# Patient Record
Sex: Male | Born: 1970 | Race: White | Hispanic: No | Smoking: Former smoker
Health system: Southern US, Community
[De-identification: ages and names within clinical notes are randomized; demographics above are authoritative.]

## PROBLEM LIST (undated history)

## (undated) DIAGNOSIS — N200 Calculus of kidney: Secondary | ICD-10-CM

## (undated) DIAGNOSIS — F319 Bipolar disorder, unspecified: Secondary | ICD-10-CM

## (undated) DIAGNOSIS — K5090 Crohn's disease, unspecified, without complications: Secondary | ICD-10-CM

## (undated) HISTORY — PX: OTHER SURGICAL HISTORY: SHX169

---

## 2009-05-24 ENCOUNTER — Emergency Department (HOSPITAL_COMMUNITY): Admission: EM | Admit: 2009-05-24 | Discharge: 2009-05-24 | Payer: Self-pay | Admitting: Emergency Medicine

## 2009-05-24 IMAGING — CT CT PELVIS W/O CM
2 of 4 series · 17 of 46 positions shown, 19 images · IV contrast (agent unspecified)
Comparison: None

CT ABDOMEN:

CLINICAL DATA: Left flank and left lower quadrant pain, nausea,
history kidney stones

CT ABDOMEN AND PELVIS WITHOUT CONTRAST:
TECHNIQUE: Multidetector helical CT imaging of the abdomen and
pelvis were performed using kidney stone protocol.  Neither oral
nor intravenous contrast utilized for this indication.  Sagittal
and coronal MPR images reconstructed from axial data set. Breast
shield utilized.

[Series 2: stone <(id) >(id) · axial · 0.81mm/px · z∈[-517,-97]mm · 14 of 92 slices shown, 16 images]
[im 4/92  soft-tissue]
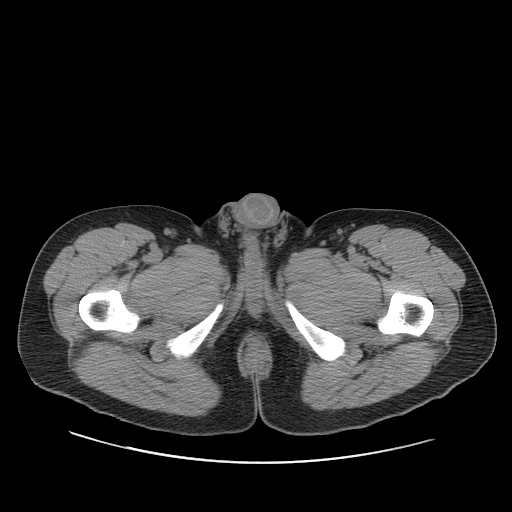
[im 4/92  bone]
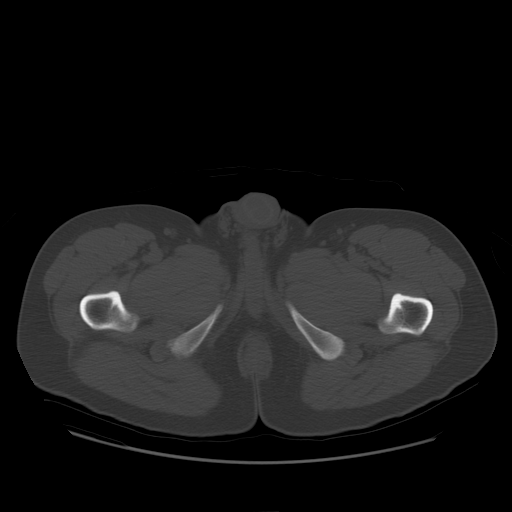
[im 12/92  soft-tissue]
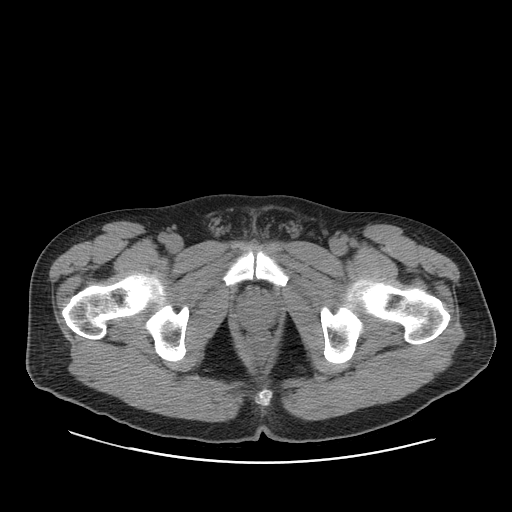
[im 19/92  soft-tissue]
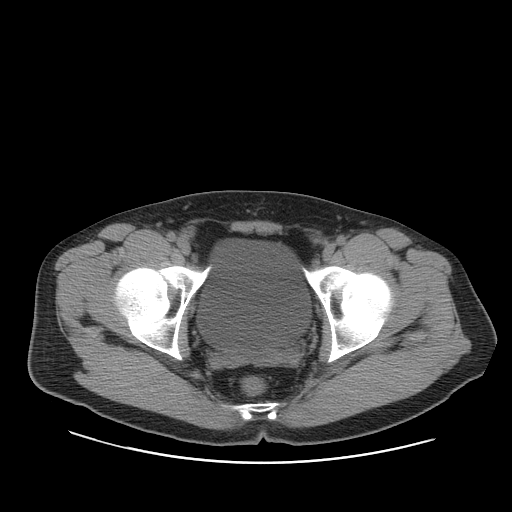
[im 23/92  soft-tissue]
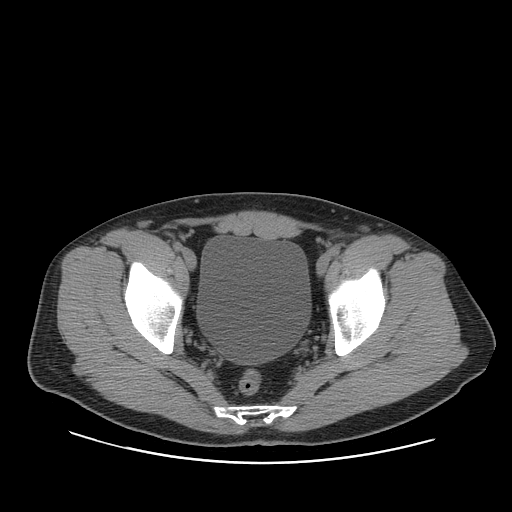
[im 31/92  soft-tissue]
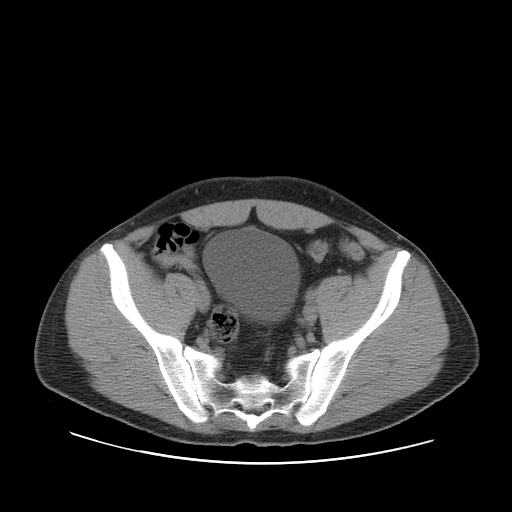
[im 38/92  soft-tissue]
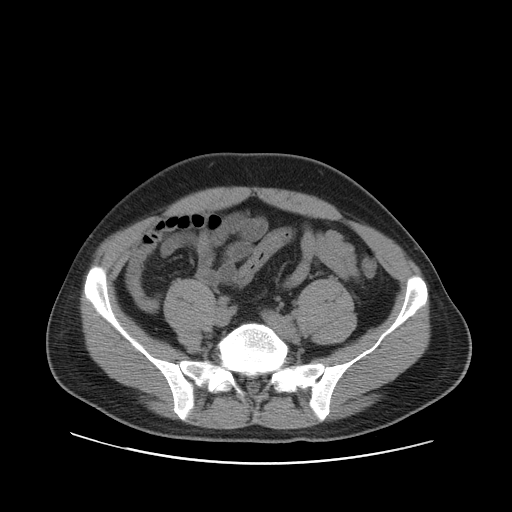
[im 42/92  soft-tissue]
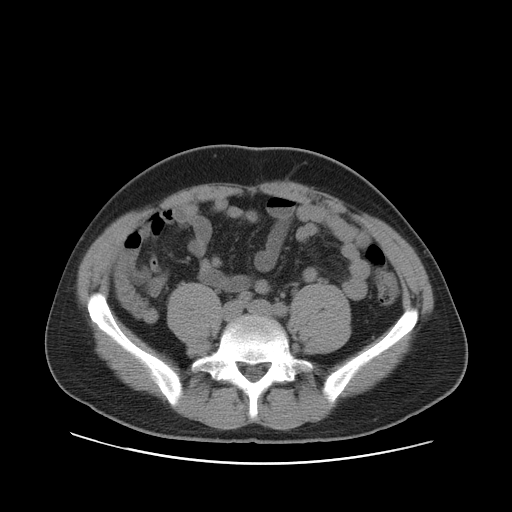
[im 50/92  soft-tissue]
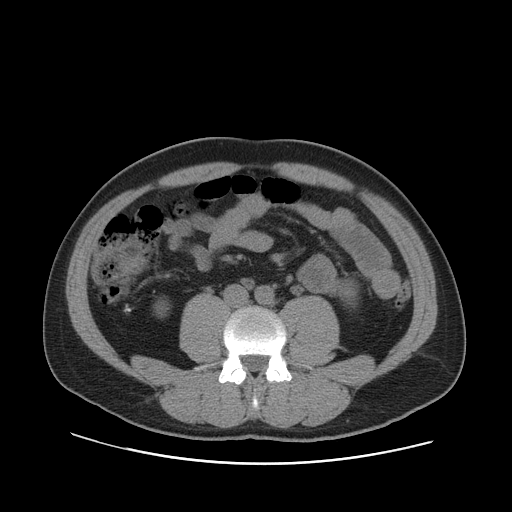
[im 54/92  soft-tissue]
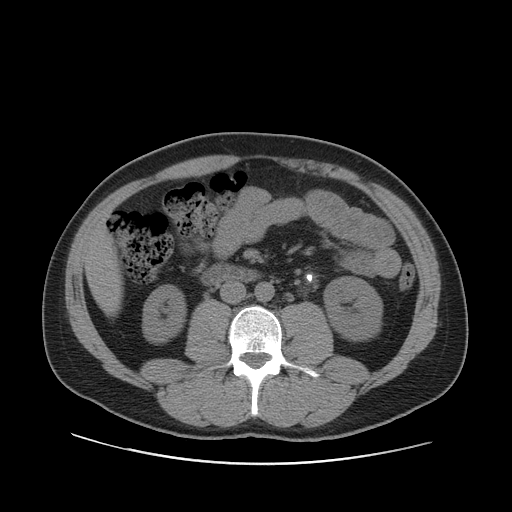
[im 54/92  bone]
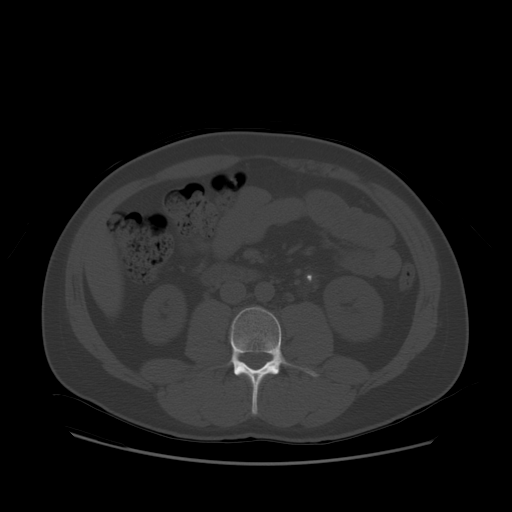
[im 61/92  soft-tissue]
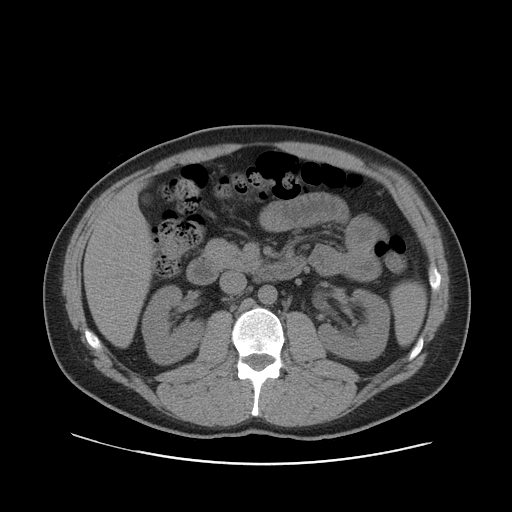
[im 69/92  soft-tissue]
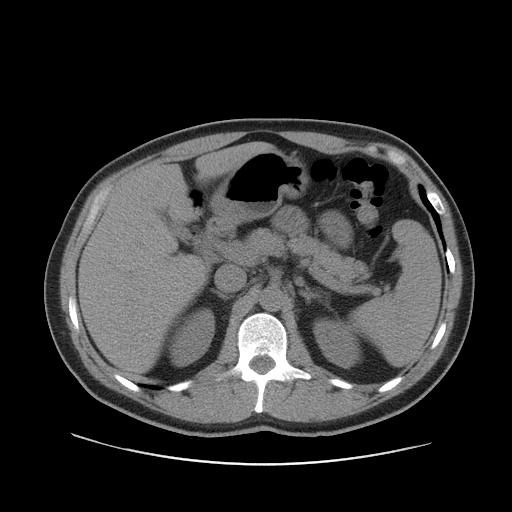
[im 73/92  soft-tissue]
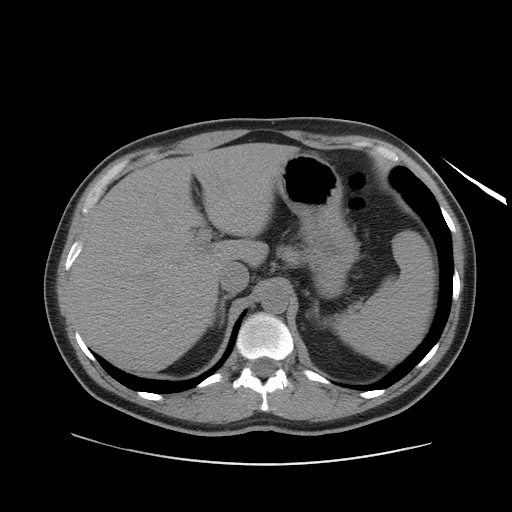
[im 80/92  soft-tissue]
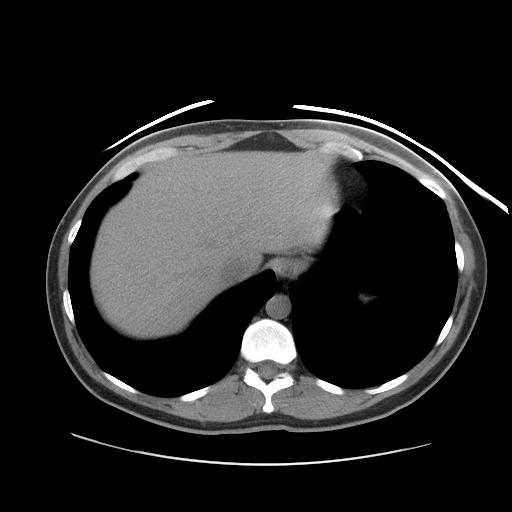
[im 88/92  soft-tissue]
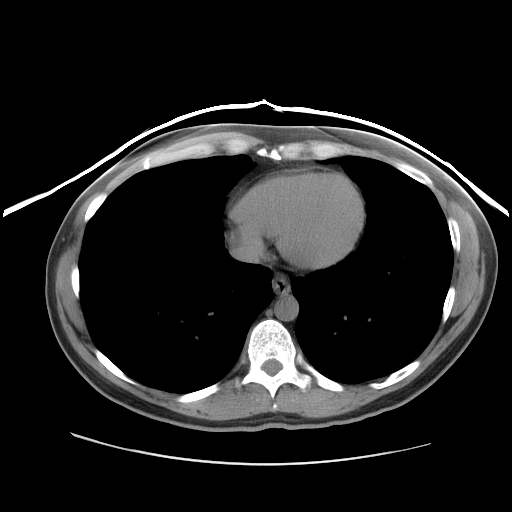

[Series 401: cor · coronal · 0.86mm/px · 3 of 97 slices shown]
[im 33/97  soft-tissue]
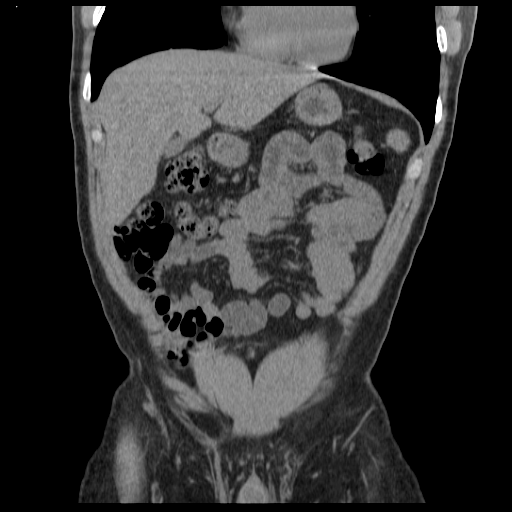
[im 43/97  soft-tissue]
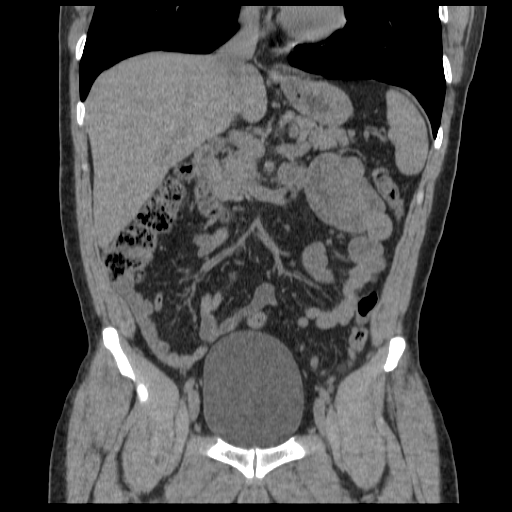
[im 54/97  soft-tissue]
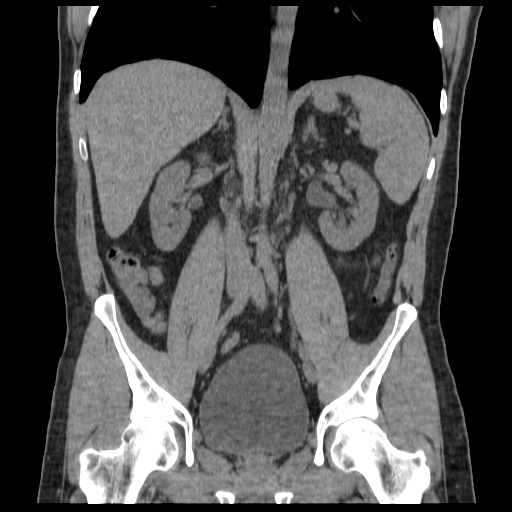

[17 of 46 positions shown; findings below may reference images not displayed]

FINDINGS: Lung bases clear.
Multiple nonobstructing left renal calculi.
In addition, mild left hydronephrosis secondary to a 6 mm diameter
calculus at the ureteral pelvic junction.
Minimal peri pelvic edema but no ureteral dilatation.
Single tiny calcified granuloma liver.
Solid organs and bowel loops in upper abdomen otherwise
unremarkable.
Prior appendectomy.
No upper abdominal mass, adenopathy, or free fluid.
IMPRESSION: Left hydronephrosis secondary to a 6 mm diameter UPJ calculus.
Additional nonobstructing left renal calculi.

CT PELVIS:
FINDINGS: No distal ureteral calcification or dilatation.
Unremarkable bladder.
No pelvic mass, adenopathy, or free fluid.
Bowel loops unremarkable.
Degenerative disc disease changes L5-S1.
IMPRESSION: No acute intrapelvic abnormalities.

## 2009-05-31 ENCOUNTER — Ambulatory Visit (HOSPITAL_COMMUNITY): Admission: RE | Admit: 2009-05-31 | Discharge: 2009-05-31 | Payer: Self-pay | Admitting: Urology

## 2009-05-31 IMAGING — CR DG ABDOMEN 1V
1 series · 1 of 1 positions shown · non-contrast
Comparison: CT on [DATE]

CLINICAL DATA: Left ureteral calculus.

ABDOMEN - 1 VIEW

[t abdomen supine]
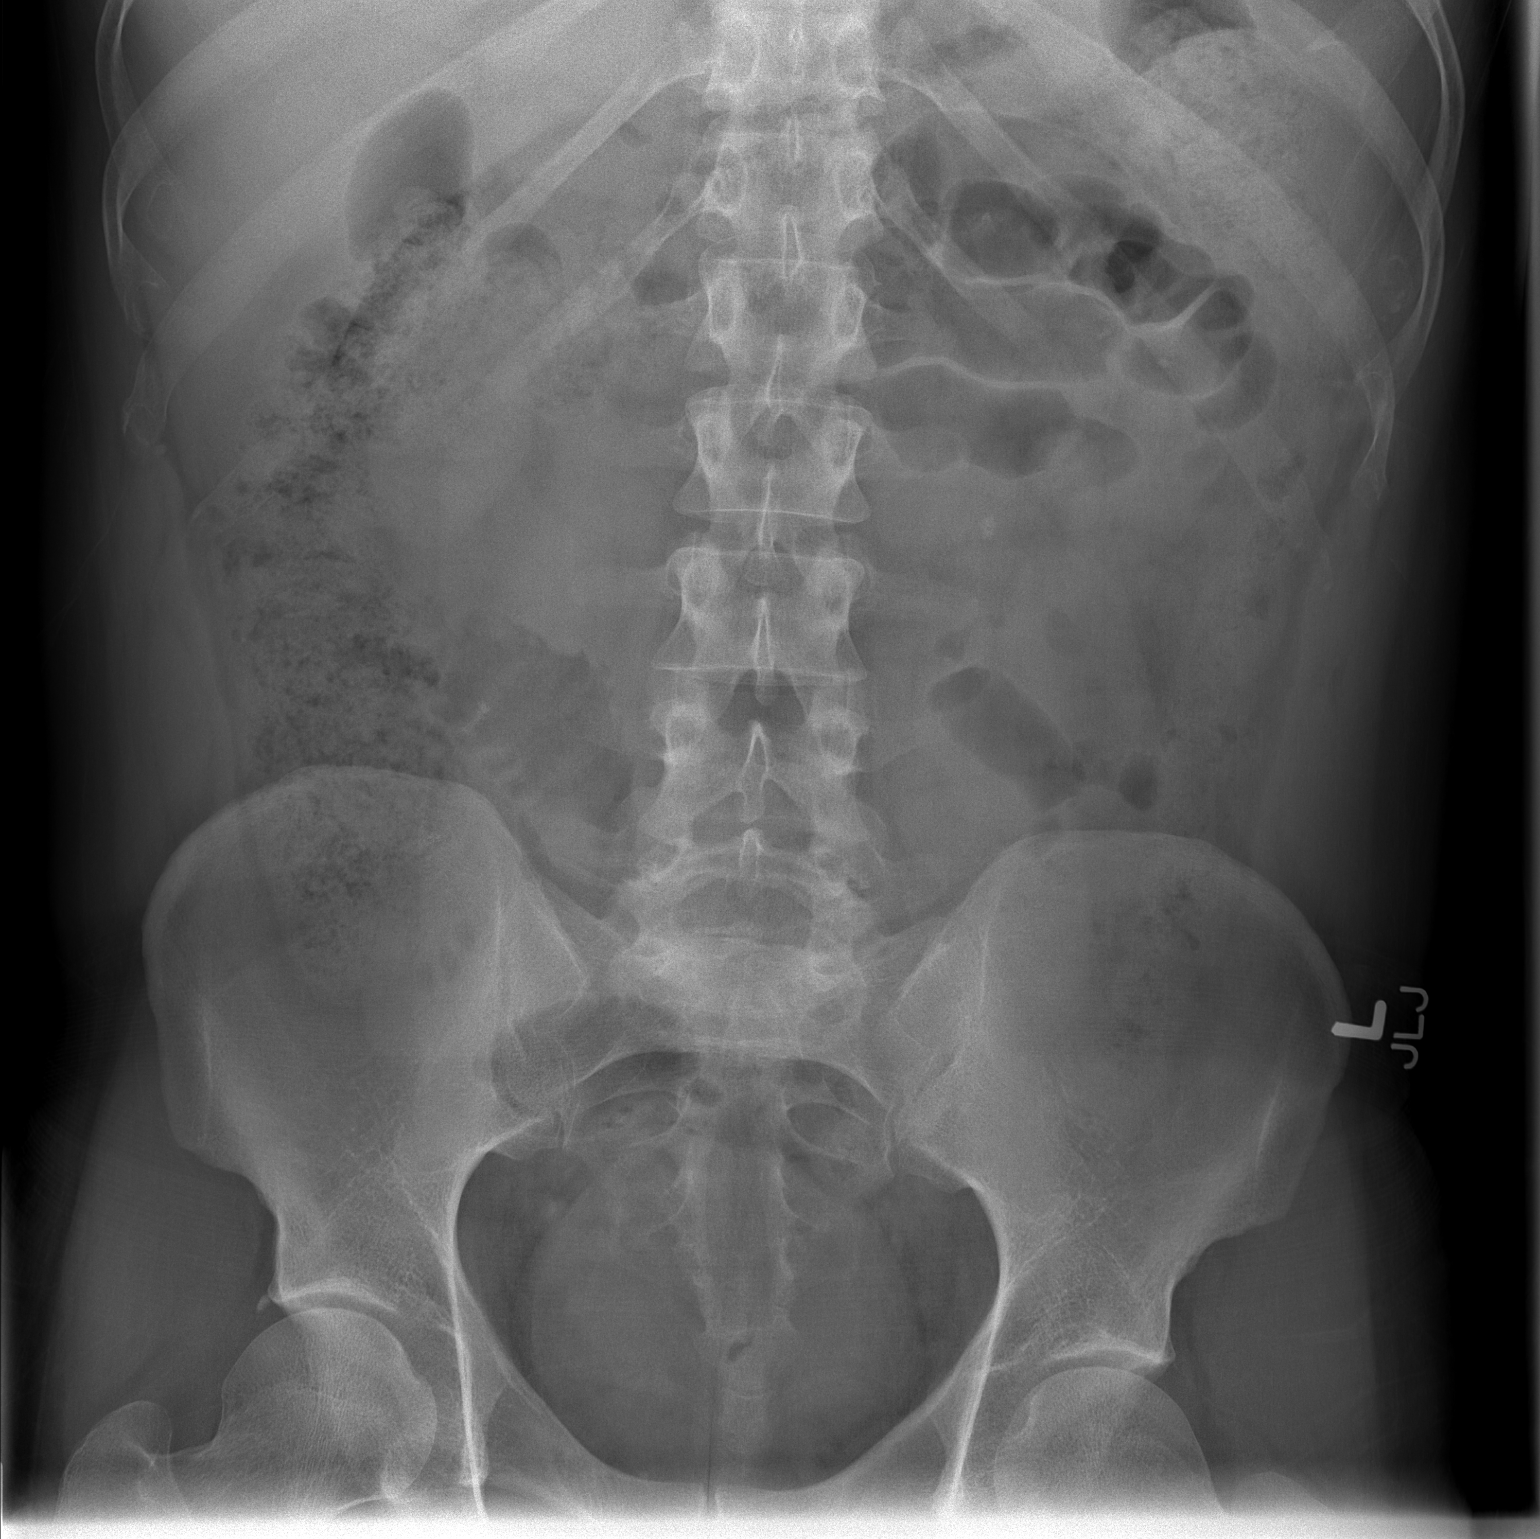

[1 of 1 positions shown; findings below may reference images not displayed]

FINDINGS: A 6 mm calculus is again seen in expected region of the
left ureteropelvic junction.  Several tiny left intrarenal calculi
are again noted.  The bowel gas pattern is normal.
IMPRESSION: 1.  6 mm calculus remains in expected position of left
ureteropelvic junction.
2.  Tiny left intrarenal calculi also noted.

## 2009-07-10 ENCOUNTER — Emergency Department (HOSPITAL_COMMUNITY): Admission: EM | Admit: 2009-07-10 | Discharge: 2009-07-11 | Payer: Self-pay | Admitting: Emergency Medicine

## 2009-07-11 IMAGING — CT CT ABDOMEN W/ CM
2 of 4 series · 13 of 32 positions shown, 18 images · IV contrast (100 ML OMNI 300)
Comparison: Plain films abdomen [DATE] and CT abdomen and
pelvis [DATE].

CT ABDOMEN

CLINICAL DATA: Abdominal pain.  History of abdominal fistula.
Question abscess.

CT ABDOMEN AND PELVIS WITH CONTRAST
TECHNIQUE: Multidetector CT imaging of the abdomen and pelvis was
performed using the standard protocol following bolus
administration of intravenous contrast.
Contrast: 100 ml [PE].

[Series 2: routine abdomen · axial · 0.78mm/px · z∈[-466,-136]mm · 5 of 100 slices shown, 10 images]
[im 17/100  soft-tissue]
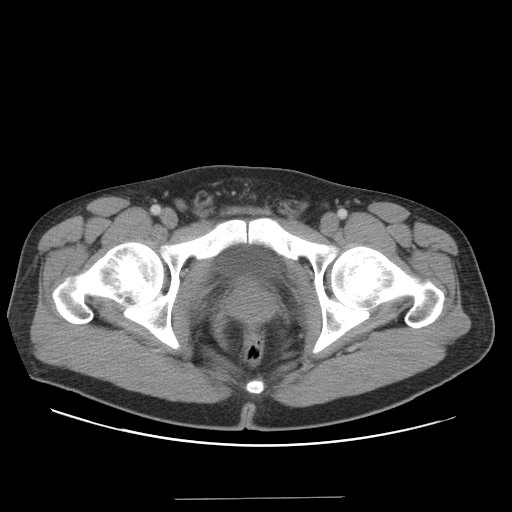
[im 17/100  bone]
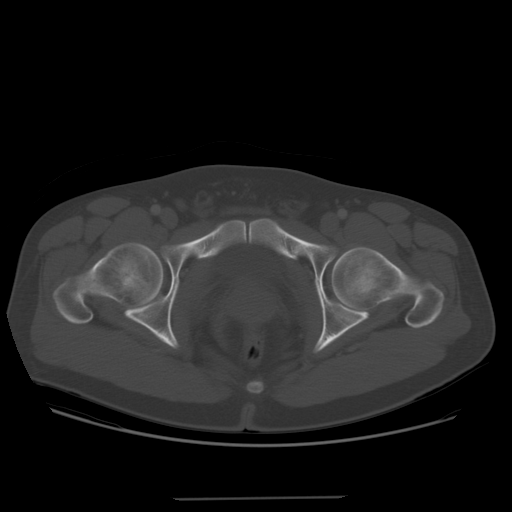
[im 34/100  soft-tissue]
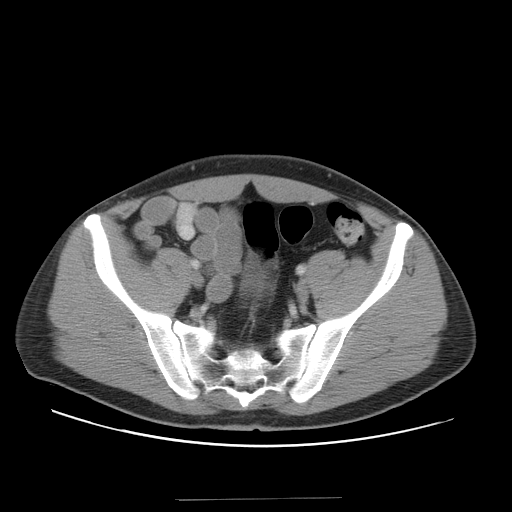
[im 34/100  lung]
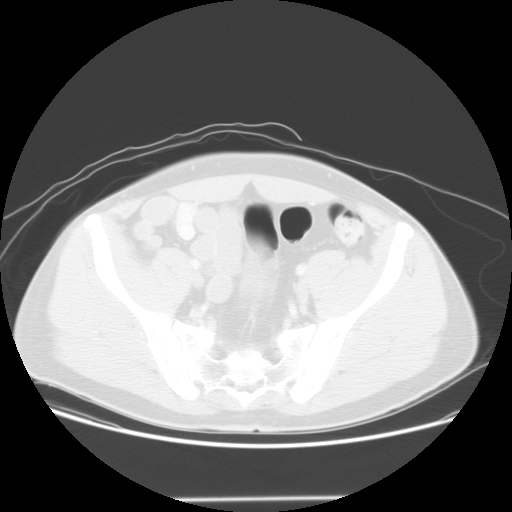
[im 50/100  soft-tissue]
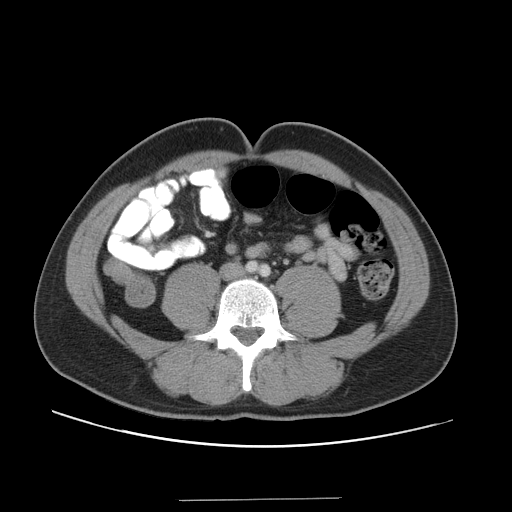
[im 50/100  lung]
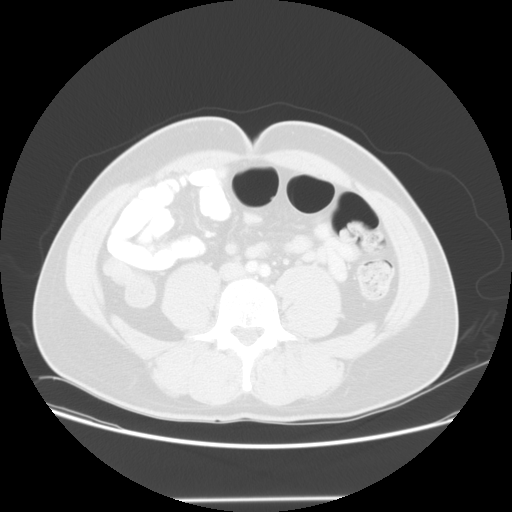
[im 67/100  soft-tissue]
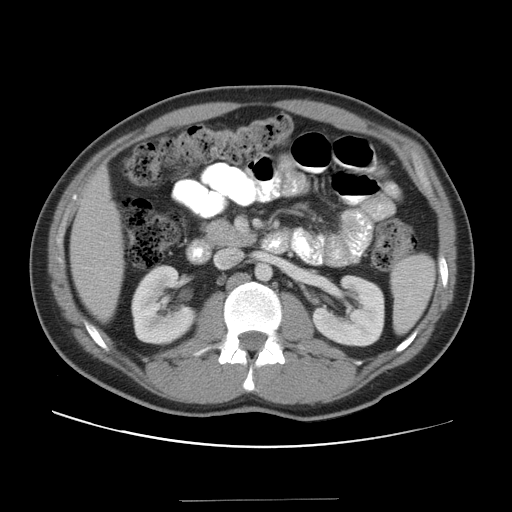
[im 67/100  lung]
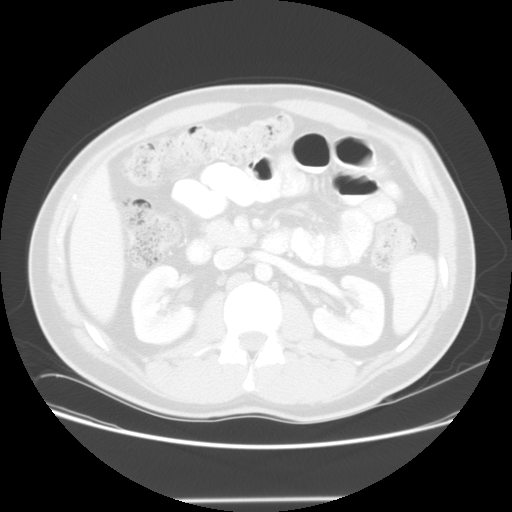
[im 83/100  soft-tissue]
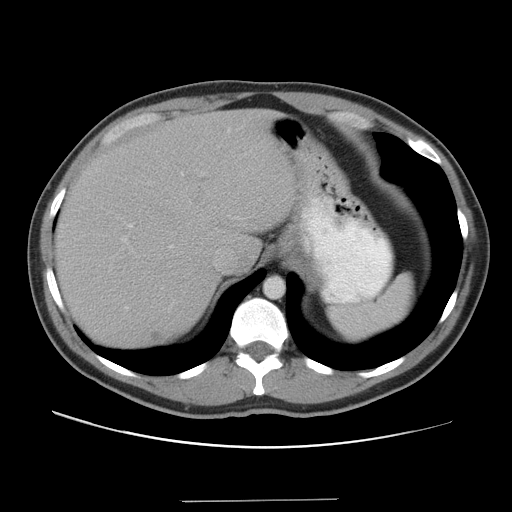
[im 83/100  lung]
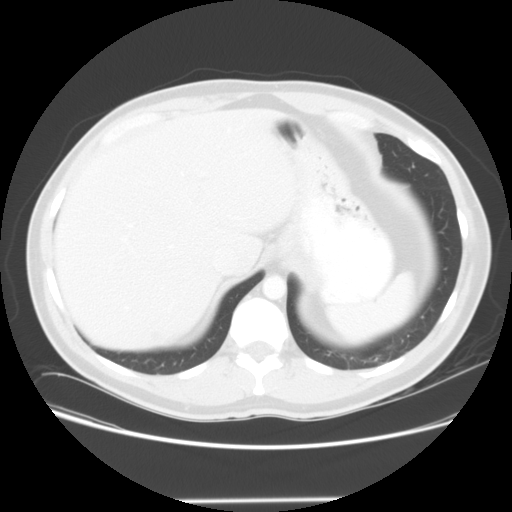

[Series 103: reformatted · sagittal · 0.99mm/px · 8 of 177 slices shown]
[im 15/177  soft-tissue]
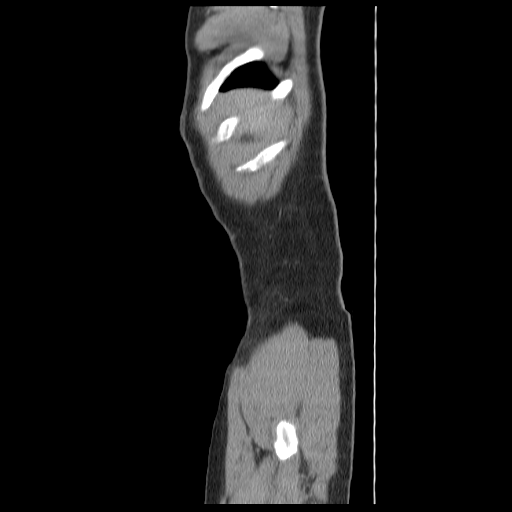
[im 45/177  soft-tissue]
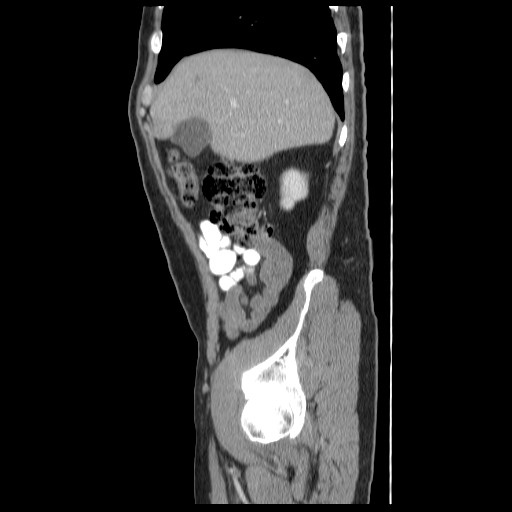
[im 59/177  soft-tissue]
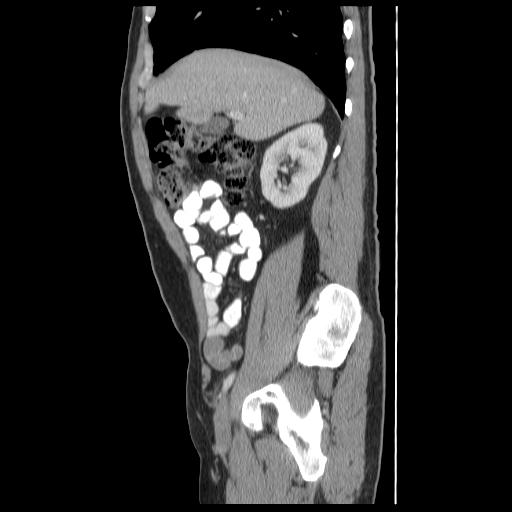
[im 74/177  soft-tissue]
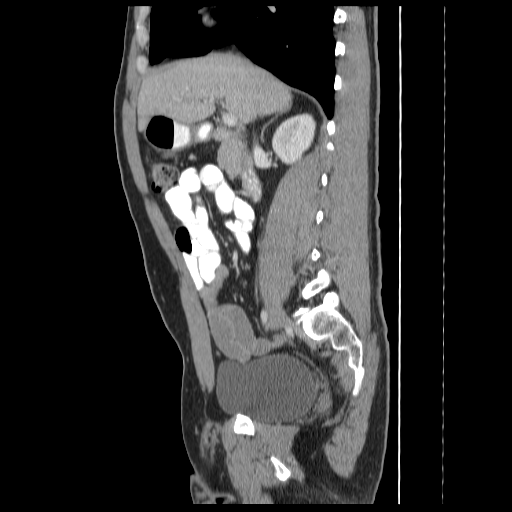
[im 103/177  soft-tissue]
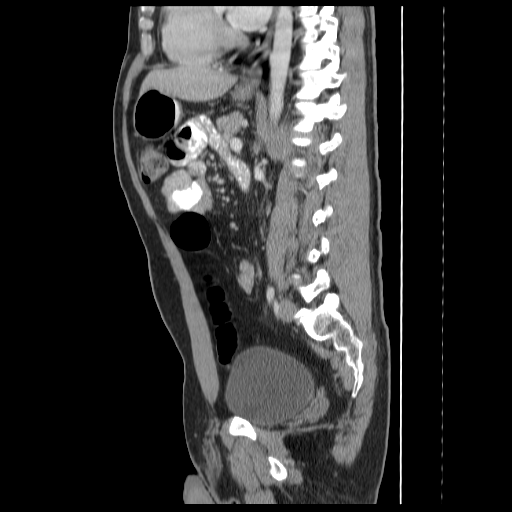
[im 118/177  soft-tissue]
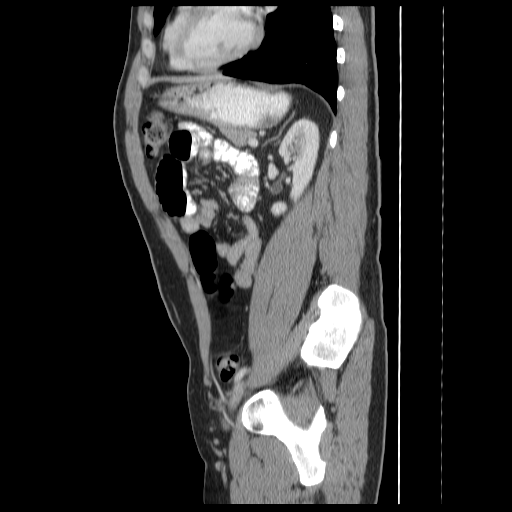
[im 133/177  soft-tissue]
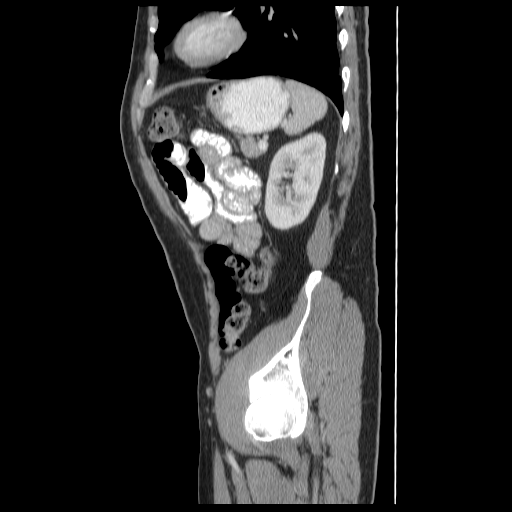
[im 162/177  soft-tissue]
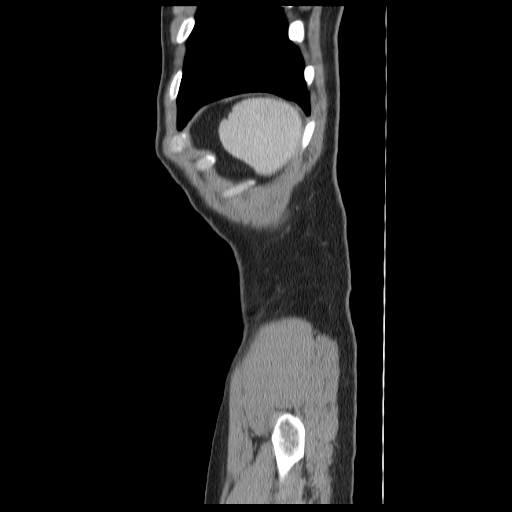

[13 of 32 positions shown; findings below may reference images not displayed]

FINDINGS: There is some linear atelectasis or scar in the lung
bases.  Lung bases otherwise clear.  No pleural or pericardial
effusion.

No abdominal fluid collection to suggest abscess is identified.
Three small lesions measuring approximate 1 cm scattered in the
liver are compatible with either small cysts or possibly
hemangiomas.  At least one of these,  has peripheral nodular
enhancement.  The liver is otherwise unremarkable.  The
gallbladder, adrenal glands, spleen, pancreas and right kidney
appear normal.  Left hydronephrosis present on the prior study is
resolved.  Two nonobstructing left renal stones are again noted.
The left ureteral stone seen on the prior study is no longer
present.  No lymphadenopathy.  Stomach and small bowel appear
normal.  No focal bony abnormality.
IMPRESSION: 1.  No acute finding in the abdomen.
2.  Two nonobstructing left renal stones.
3.  Three small hepatic cysts versus hemangiomas.  One of these has
features typical of a hemangioma.

CT PELVIS
FINDINGS: There is no pelvic abscess.  No lymphadenopathy.
Urinary bladder, seminal vesicles and prostate gland appear normal.
The colon appears normal.  The appendix has been removed.  The no
focal bony abnormality.
IMPRESSION: Negative for pelvic abscess.  Negative pelvic CT.

## 2010-07-12 ENCOUNTER — Emergency Department: Payer: Self-pay | Admitting: Emergency Medicine

## 2010-07-12 IMAGING — CR DG SHOULDER 3+V*L*
1 series · 3 of 3 positions shown · non-contrast
Comparison: none

REASON FOR EXAM: increased pain
COMMENTS:

PROCEDURE:     DXR - DXR SHOULDER LEFT COMPLETE  - [DATE]  [DATE]
RESULT:     Three views of the left shoulder are submitted. The glenohumeral
joint is normally positioned. The AC joint does not appear abnormally
widened. There is no evidence of an acute fracture.

[Series 1: view not recorded · 0.17mm/px · 3 of 3 slices shown]
[im 1/3]
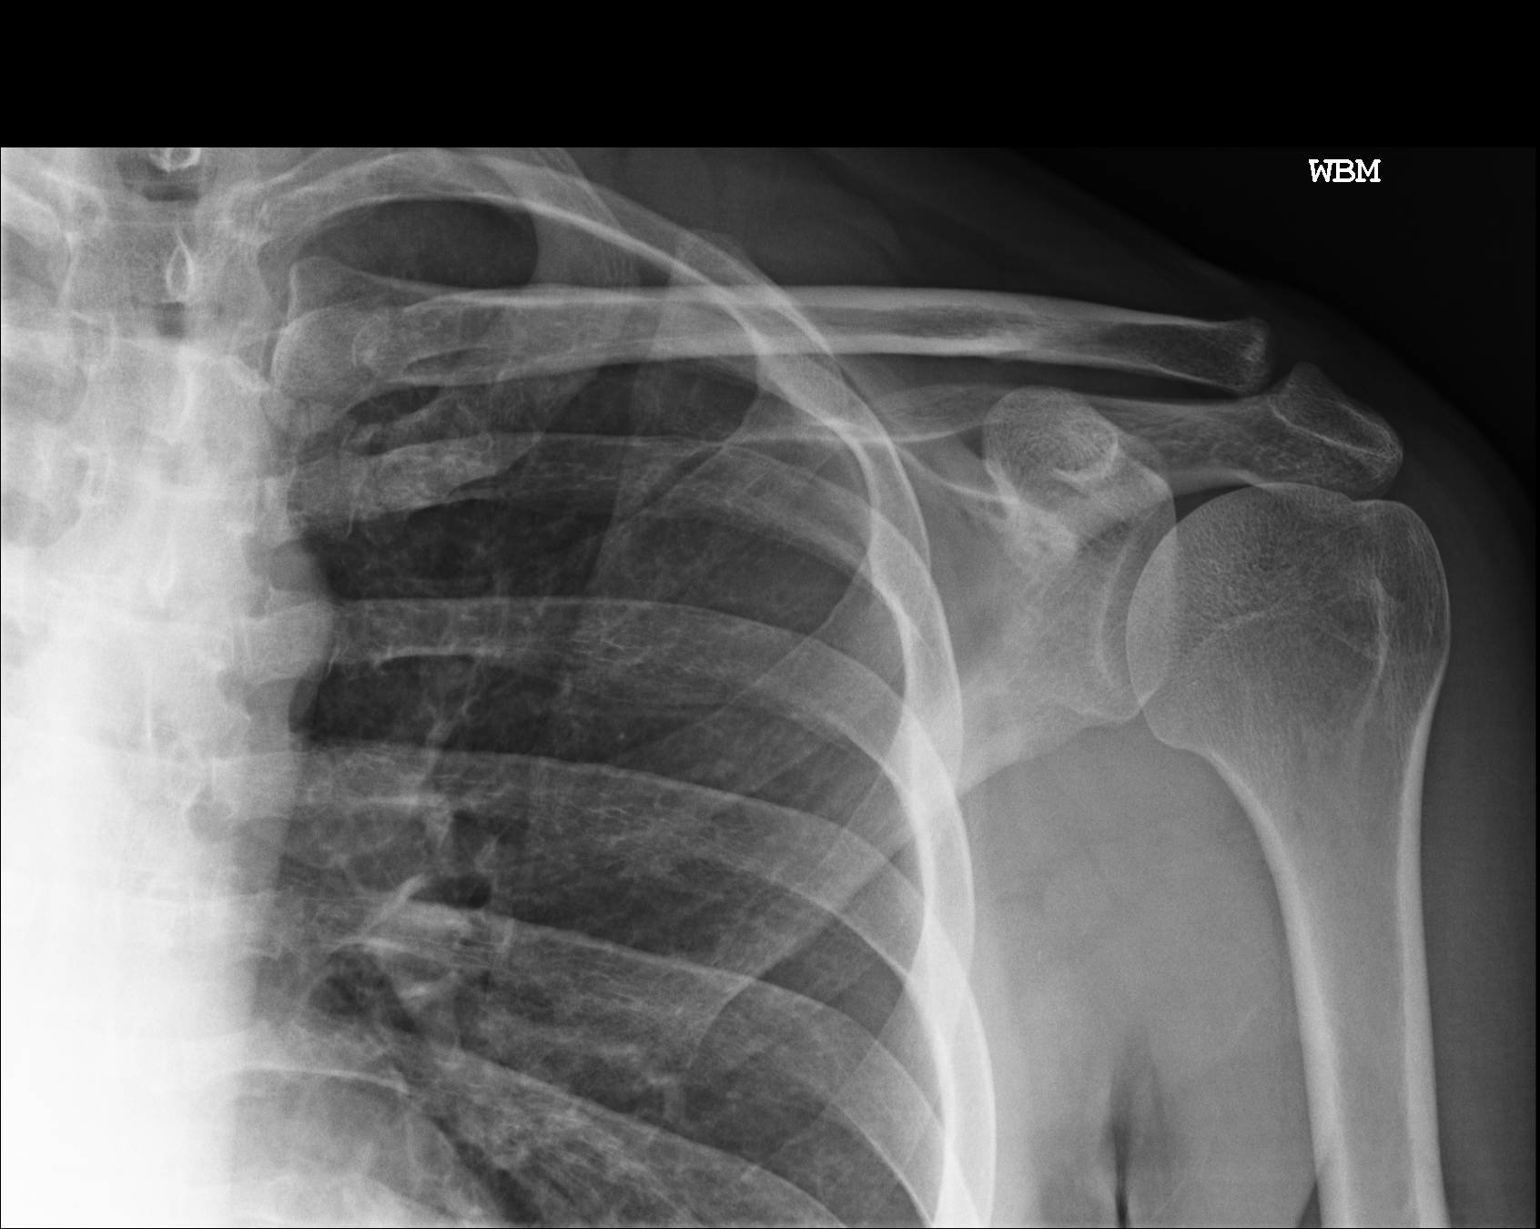
[im 2/3]
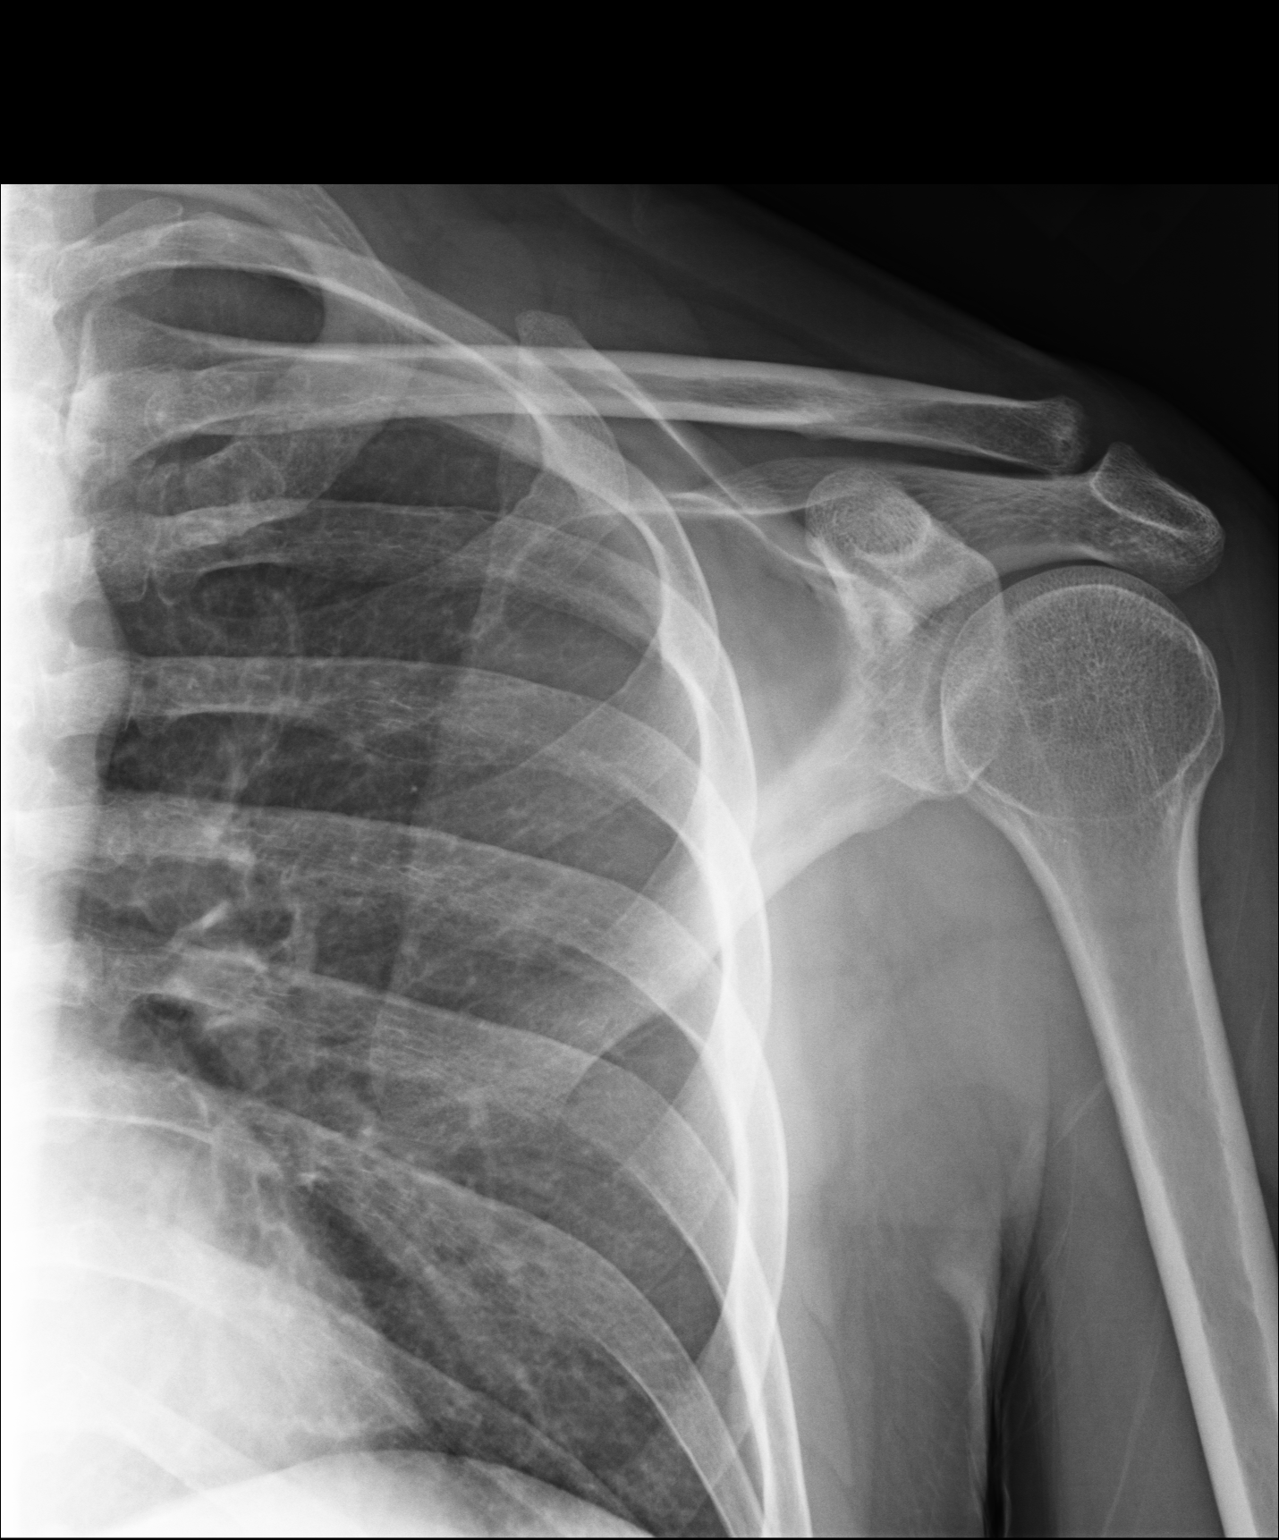
[im 3/3]
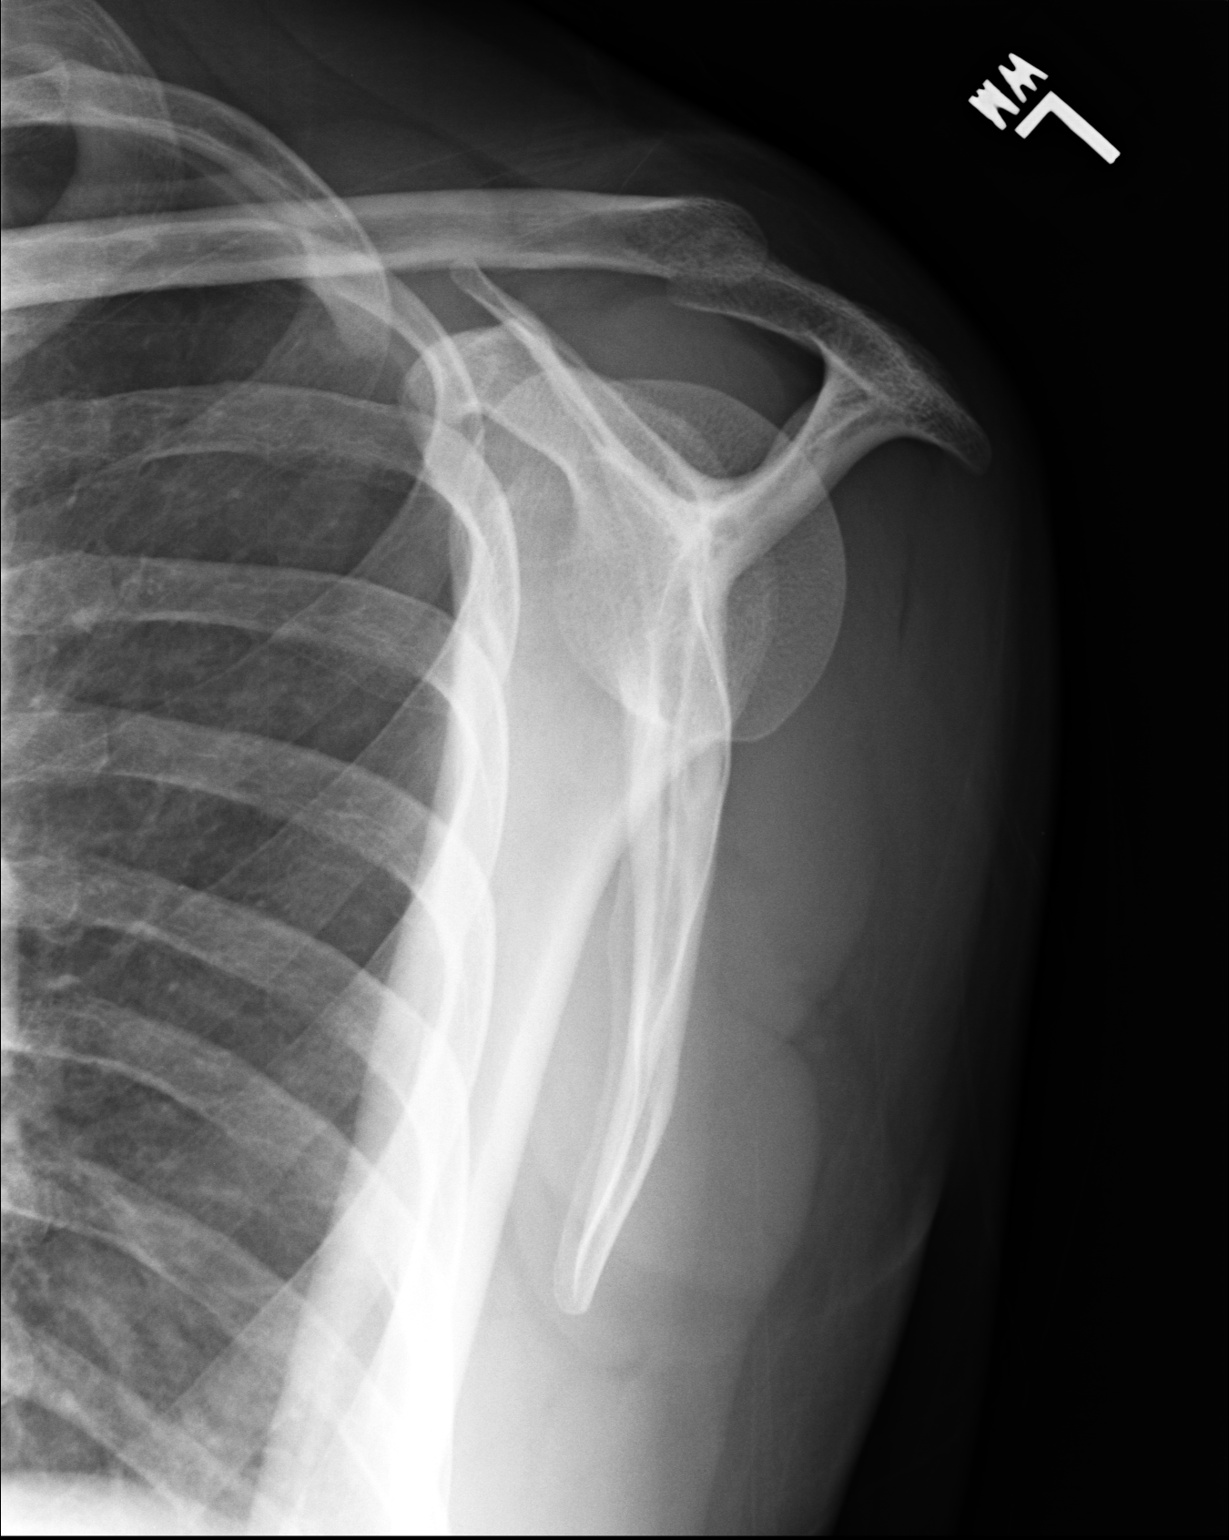

[3 of 3 positions shown; findings below may reference images not displayed]

IMPRESSION: I do not see evidence of fracture or dislocation involving
the glenohumeral joint nor the AC joint.

## 2010-12-19 LAB — BASIC METABOLIC PANEL
BUN: 17 mg/dL (ref 6–23)
CO2: 27 mEq/L (ref 19–32)
Calcium: 9.2 mg/dL (ref 8.4–10.5)
Chloride: 104 mEq/L (ref 96–112)
Creatinine, Ser: 0.83 mg/dL (ref 0.4–1.5)
GFR calc Af Amer: >60 mL/min (ref >60)
Potassium: 4.1 mEq/L (ref 3.5–5.1)

## 2010-12-19 LAB — DIFFERENTIAL
Eosinophils Relative: 3 % (ref 0–5)
Lymphs Abs: 3.8 10*3/uL (ref 0.7–4.0)
Monocytes Relative: 10 % (ref 3–12)
Neutro Abs: 3.7 10*3/uL (ref 1.7–7.7)
Neutrophils Relative %: 43 % (ref 43–77)

## 2010-12-19 LAB — URINALYSIS, ROUTINE W REFLEX MICROSCOPIC
Bilirubin Urine: NEGATIVE
Nitrite: NEGATIVE
Protein, ur: NEGATIVE mg/dL

## 2010-12-19 LAB — CBC
HCT: 44.5 % (ref 39.0–52.0)
Hemoglobin: 15.7 g/dL (ref 13.0–17.0)
MCHC: 35.2 g/dL (ref 30.0–36.0)
MCV: 90.8 fL (ref 78.0–100.0)
Platelets: 216 10*3/uL (ref 150–400)
RBC: 4.91 MIL/uL (ref 4.22–5.81)
RDW: 12.5 % (ref 11.5–15.5)

## 2010-12-20 LAB — URINALYSIS, ROUTINE W REFLEX MICROSCOPIC
Bilirubin Urine: NEGATIVE
Protein, ur: NEGATIVE mg/dL
pH: 7 (ref 5.0–8.0)

## 2010-12-20 LAB — CBC
RDW: 13.6 % (ref 11.5–15.5)
WBC: 9 10*3/uL (ref 4.0–10.5)

## 2010-12-20 LAB — DIFFERENTIAL
Basophils Relative: 0 % (ref 0–1)
Lymphocytes Relative: 21 % (ref 12–46)
Monocytes Relative: 9 % (ref 3–12)

## 2010-12-20 LAB — BASIC METABOLIC PANEL
BUN: 9 mg/dL (ref 6–23)
CO2: 24 mEq/L (ref 19–32)
GFR calc non Af Amer: >60 mL/min (ref >60)
Glucose, Bld: 102 mg/dL — ABNORMAL HIGH (ref 70–99)

## 2010-12-20 LAB — URINE MICROSCOPIC-ADD ON

## 2014-06-11 ENCOUNTER — Emergency Department: Payer: Self-pay | Admitting: Emergency Medicine

## 2014-06-11 IMAGING — CT CT HEAD WITHOUT CONTRAST
1 series · 16 of 30 positions shown, 20 images · non-contrast
Comparison: None.

CLINICAL DATA: Headache.

EXAM:
CT HEAD WITHOUT CONTRAST
TECHNIQUE: Contiguous axial images were obtained from the base of the skull
through the vertex without intravenous contrast.

[Series 2: head wo · axial · 0.42mm/px · z∈[-112,+32]mm · 16 of 36 slices shown, 20 images]
[im 2/36  brain]
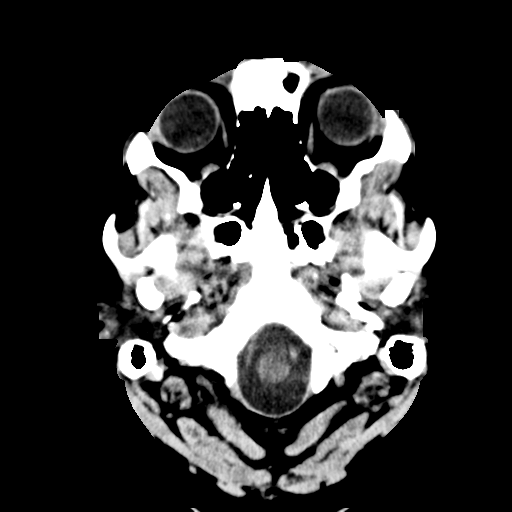
[im 2/36  bone]
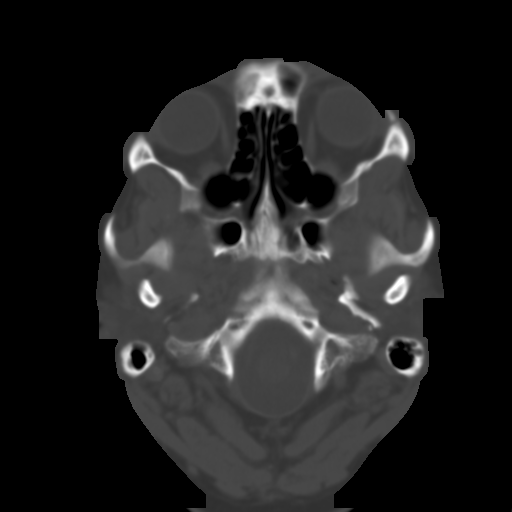
[im 4/36  brain]
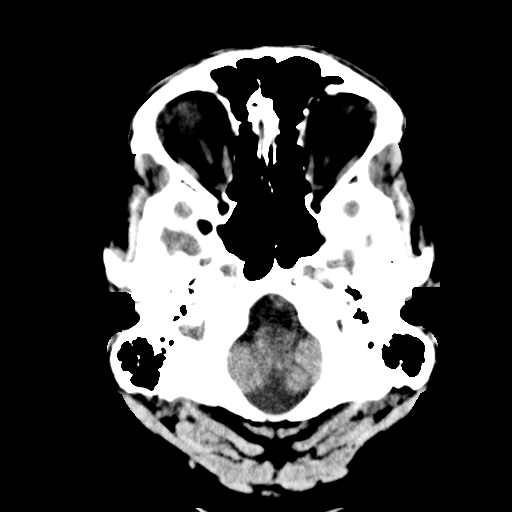
[im 7/36  brain]
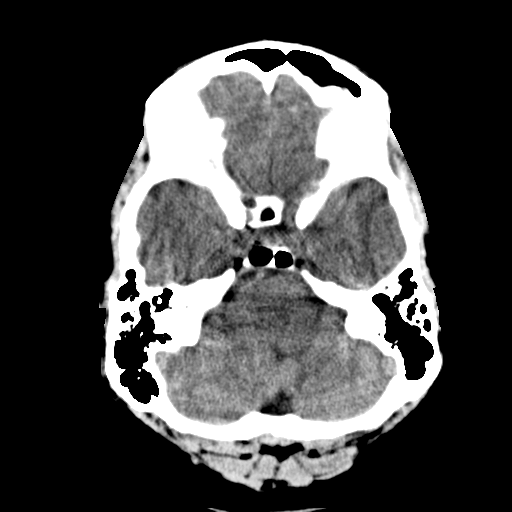
[im 9/36  brain]
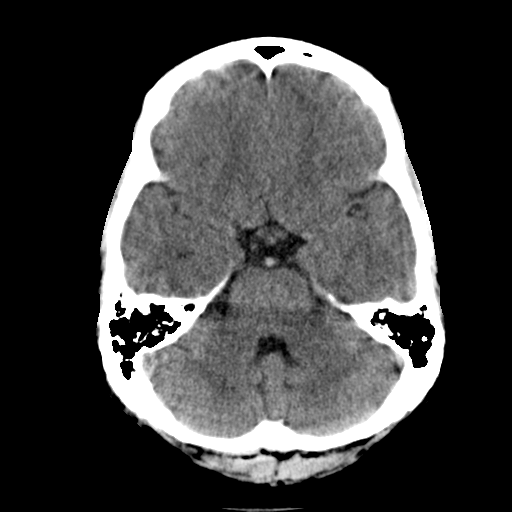
[im 10/36  brain]
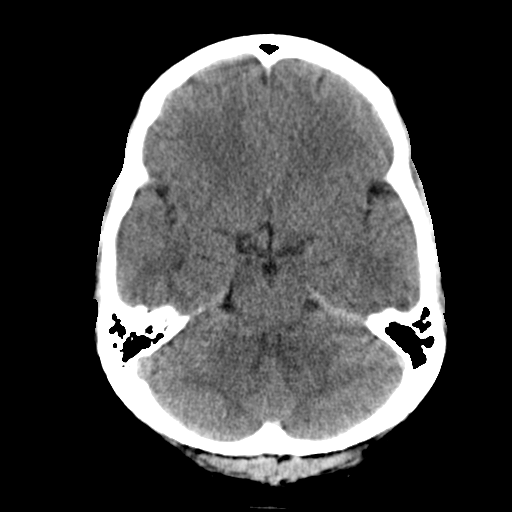
[im 10/36  bone]
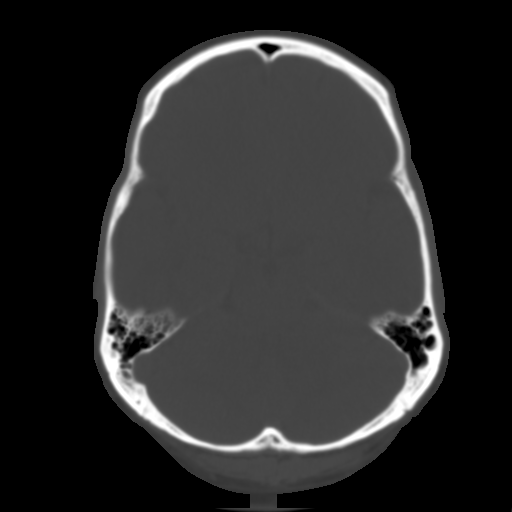
[im 13/36  brain]
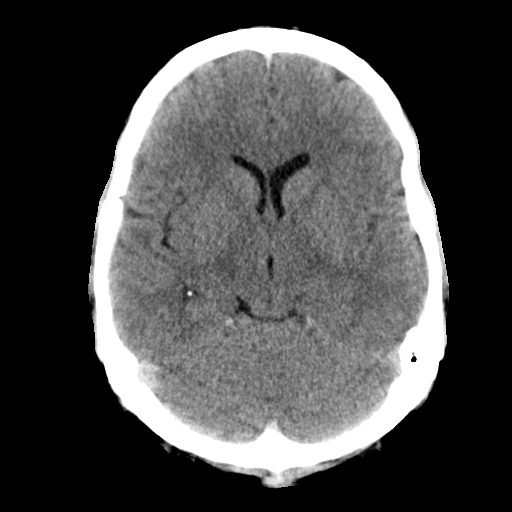
[im 15/36  brain]
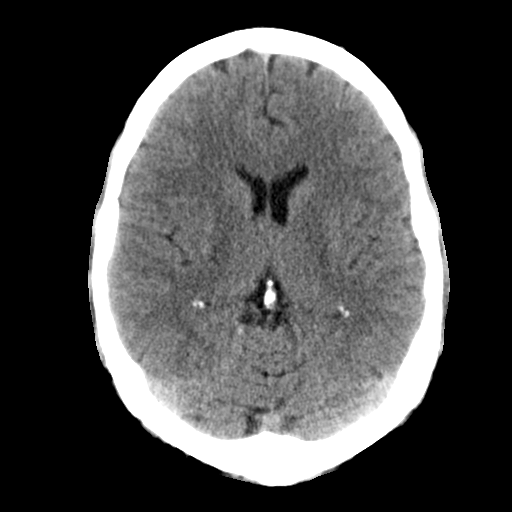
[im 17/36  brain]
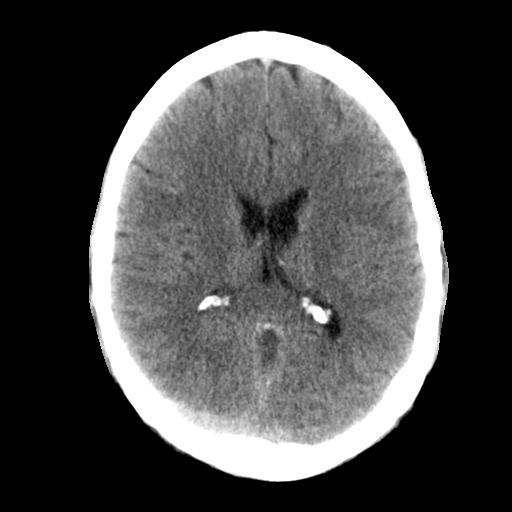
[im 19/36  brain]
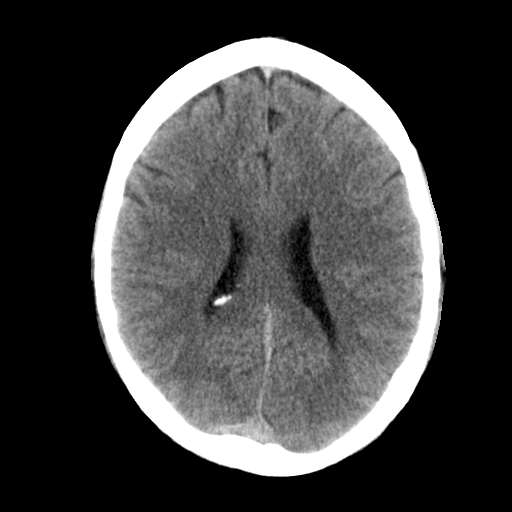
[im 19/36  bone]
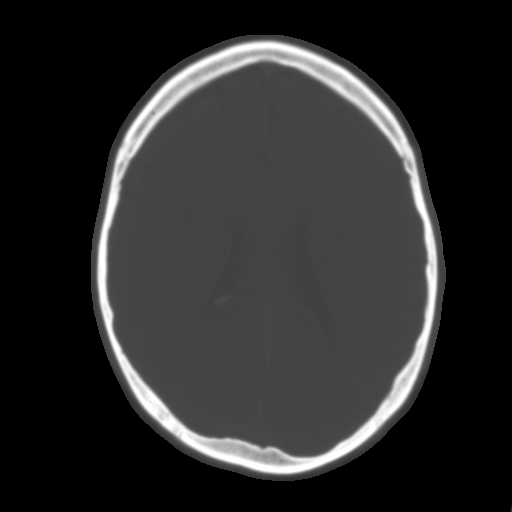
[im 21/36  brain]
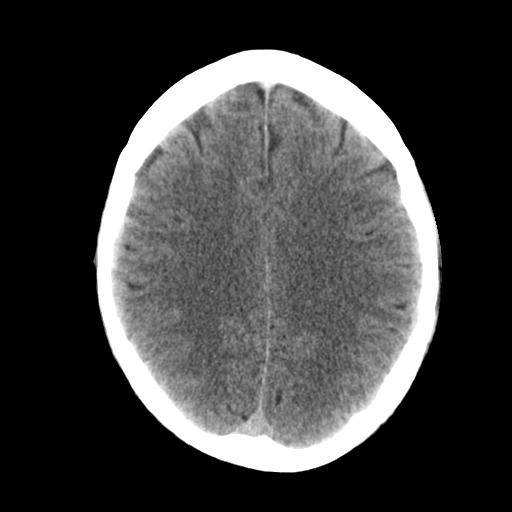
[im 23/36  brain]
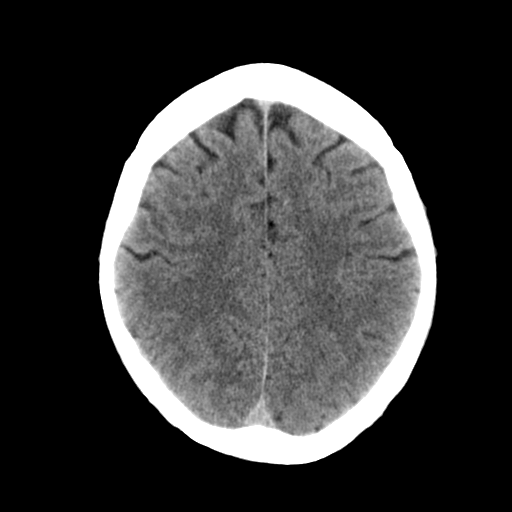
[im 26/36  brain]
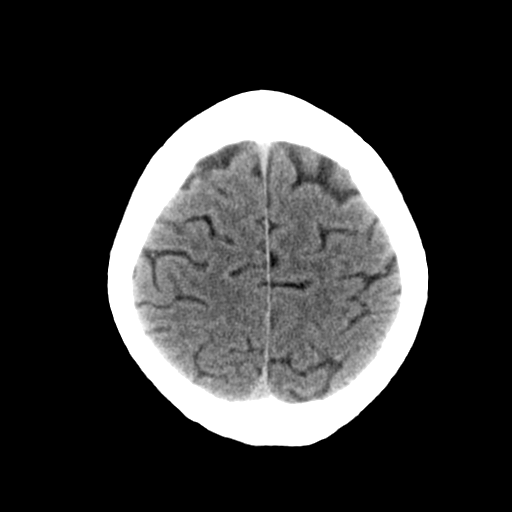
[im 27/36  brain]
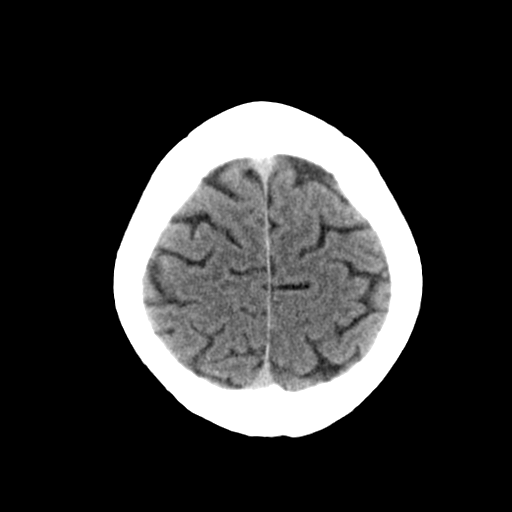
[im 27/36  bone]
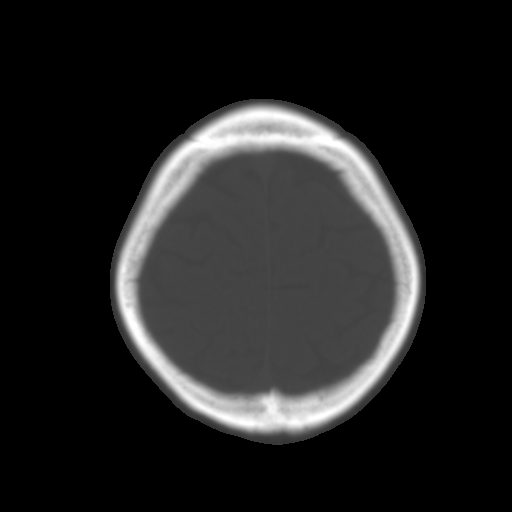
[im 29/36  brain]
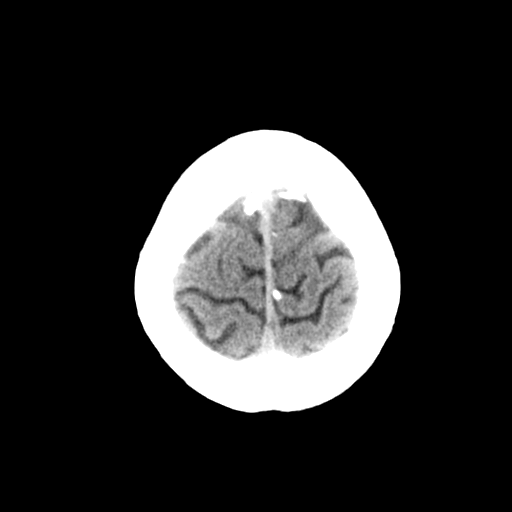
[im 32/36  brain]
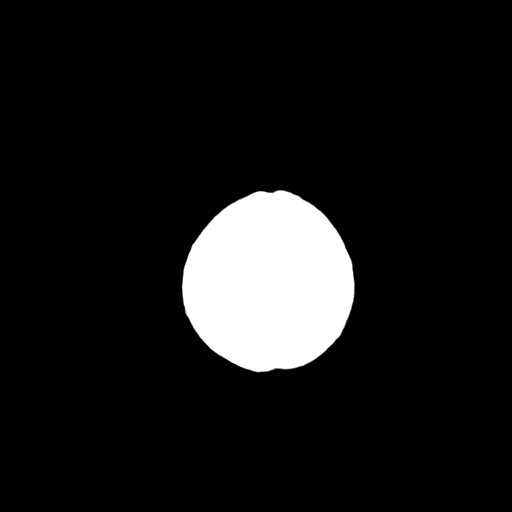
[im 34/36  brain]
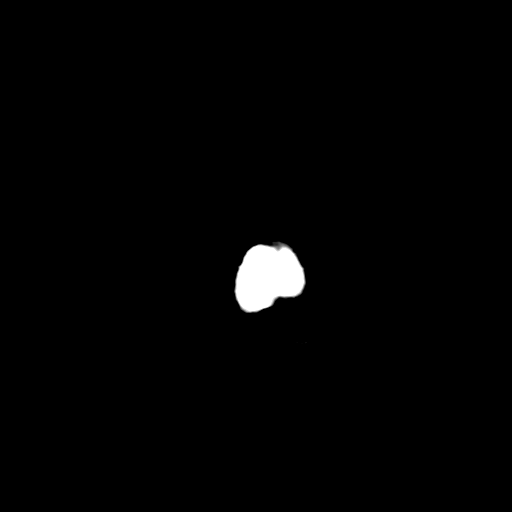

[16 of 30 positions shown; findings below may reference images not displayed]

FINDINGS: Bony calvarium appears intact. No mass effect or midline shift is
noted. Ventricular size is within normal limits. There is no
evidence of mass lesion, hemorrhage or acute infarction.
IMPRESSION: Normal head CT.

## 2016-08-31 ENCOUNTER — Encounter: Payer: Self-pay | Admitting: Emergency Medicine

## 2016-08-31 ENCOUNTER — Emergency Department
Admission: EM | Admit: 2016-08-31 | Discharge: 2016-08-31 | Disposition: A | Discharge status: Home / Self Care | Payer: Medicare Other | Attending: Emergency Medicine | Admitting: Emergency Medicine

## 2016-08-31 DIAGNOSIS — R202 Paresthesia of skin: Secondary | ICD-10-CM | POA: Diagnosis not present

## 2016-08-31 DIAGNOSIS — K529 Noninfective gastroenteritis and colitis, unspecified: Secondary | ICD-10-CM | POA: Insufficient documentation

## 2016-08-31 DIAGNOSIS — Z87891 Personal history of nicotine dependence: Secondary | ICD-10-CM | POA: Diagnosis not present

## 2016-08-31 DIAGNOSIS — R112 Nausea with vomiting, unspecified: Secondary | ICD-10-CM | POA: Diagnosis present

## 2016-08-31 HISTORY — DX: Bipolar disorder, unspecified: F31.9

## 2016-08-31 HISTORY — DX: Crohn's disease, unspecified, without complications: K50.90

## 2016-08-31 LAB — COMPREHENSIVE METABOLIC PANEL
ALK PHOS: 44 U/L (ref 38–126)
ALT: 28 U/L (ref 17–63)
AST: 25 U/L (ref 15–41)
Albumin: 4.3 g/dL (ref 3.5–5.0)
Anion gap: 6 (ref 5–15)
BILIRUBIN TOTAL: 0.7 mg/dL (ref 0.3–1.2)
BUN: 15 mg/dL (ref 6–20)
CALCIUM: 8.9 mg/dL (ref 8.9–10.3)
CO2: 21 mmol/L — AB (ref 22–32)
CREATININE: 0.91 mg/dL (ref 0.61–1.24)
Chloride: 110 mmol/L (ref 101–111)
GFR calc non Af Amer: >60 mL/min (ref >60)
GLUCOSE: 88 mg/dL (ref 65–99)
Potassium: 3.8 mmol/L (ref 3.5–5.1)
SODIUM: 137 mmol/L (ref 135–145)
TOTAL PROTEIN: 7.3 g/dL (ref 6.5–8.1)

## 2016-08-31 LAB — CBC WITH DIFFERENTIAL/PLATELET
Basophils Absolute: 0 10*3/uL (ref 0–0.1)
Basophils Relative: 0 %
EOS ABS: 0.1 10*3/uL (ref 0–0.7)
Eosinophils Relative: 1 %
HEMATOCRIT: 40.2 % (ref 40.0–52.0)
HEMOGLOBIN: 14.5 g/dL (ref 13.0–18.0)
LYMPHS ABS: 1.4 10*3/uL (ref 1.0–3.6)
LYMPHS PCT: 15 %
MCH: 31.1 pg (ref 26.0–34.0)
MCHC: 35.9 g/dL (ref 32.0–36.0)
MCV: 86.6 fL (ref 80.0–100.0)
MONOS PCT: 7 %
Monocytes Absolute: 0.6 10*3/uL (ref 0.2–1.0)
NEUTROS ABS: 6.9 10*3/uL — AB (ref 1.4–6.5)
NEUTROS PCT: 77 %
Platelets: 224 10*3/uL (ref 150–440)
RBC: 4.65 MIL/uL (ref 4.40–5.90)
RDW: 12.5 % (ref 11.5–14.5)
WBC: 9 10*3/uL (ref 3.8–10.6)

## 2016-08-31 LAB — LIPASE, BLOOD: Lipase: 21 U/L (ref 11–51)

## 2016-08-31 LAB — ETHANOL: Alcohol, Ethyl (B): <5 mg/dL (ref <5)

## 2016-08-31 LAB — MAGNESIUM: Magnesium: 1.9 mg/dL (ref 1.7–2.4)

## 2016-08-31 MED ORDER — PROMETHAZINE HCL 12.5 MG PO TABS: 12.5000 mg | ORAL_TABLET | Freq: Four times a day (QID) | ORAL | 0 refills | Status: DC | PRN

## 2016-08-31 MED ORDER — PROMETHAZINE HCL 25 MG/ML IJ SOLN
12.5000 mg | Freq: Once | INTRAMUSCULAR | Status: AC
  Administered 2016-08-31: 12.5 mg via INTRAVENOUS
  Filled 2016-08-31: qty 1

## 2016-08-31 MED ORDER — SODIUM CHLORIDE 0.9 % IV BOLUS (SEPSIS)
1000.0000 mL | Freq: Once | INTRAVENOUS | Status: AC
  Administered 2016-08-31: 1000 mL via INTRAVENOUS

## 2016-08-31 NOTE — ED Provider Notes (Addendum)
[facility] Emergency Department Provider Note  ____________________________________________   I have reviewed the triage vital signs and the nursing notes.   HISTORY  Chief Complaint Abdominal Pain and Emesis    HPI Jay Mcneil is a 45 y.o. male who presents today complaining of nausea and vomiting and diarrhea. The symptoms started about 4 or 5 days ago, he aches and has much less nausea and vomiting since that time. His stool is starting to become a little more solid. He has not vomited in 2 days. However, he feels weak and woozy. Patient states that sometimes the feels anxious and is tingling all over his entire body. This did happen while he was here, he showed normal sinus on the monitor but it did show that his respiratory rate had increased. He states "is like him hyperventilating but I don't think I am". He denies any fever. He actually has no abdominal pain. He had cramping with his bowel movements but that is completely cleared up. He feels that he is getting better but he is worried that he was very tired yesterday and today and he is ongoing nausea.He has no pulmonary complaints.  He has had no bleeding or mucousy stool and he does not feel that this is a Crohn's flare. Patient has had at least 7 different CT scans for similar symptoms in the past.    Past Medical History:  Diagnosis Date  . Bipolar disorder (HCC)   . Crohn's disease (HCC)     There are no active problems to display for this patient.   Past Surgical History:  Procedure Laterality Date  . perianal surgery      Prior to Admission medications   Not on File    Allergies Erythromycin; Penicillins; and Sulfa antibiotics  No family history on file.  Social History Social History  Substance Use Topics  . Smoking status: Former Games developermoker  . Smokeless tobacco: Never Used  . Alcohol use Yes     Comment: occ    Review of Systems Constitutional: No fever/chills Eyes: No  visual changes. ENT: No sore throat. No stiff neck no neck pain Cardiovascular: Denies chest pain. Respiratory: Denies shortness of breath. Gastrointestinal:   See history of present illness Genitourinary: Negative for dysuria. Musculoskeletal: Negative lower extremity swelling Skin: Negative for rash. Neurological: Negative for severe headaches, focal weakness or numbness. 10-point ROS otherwise negative.  ____________________________________________   PHYSICAL EXAM:  VITAL SIGNS: ED Triage Vitals  Enc Vitals Group     BP 08/31/16 0918 118/75     Pulse Rate 08/31/16 0918 90     Resp 08/31/16 0918 16     Temp 08/31/16 0918 98.1 F (36.7 C)     Temp Source 08/31/16 0918 Oral     SpO2 08/31/16 0918 99 %     Weight 08/31/16 0921 165 lb (74.8 kg)     Height 08/31/16 0921 6' (1.829 m)     Head Circumference --      Peak Flow --      Pain Score 08/31/16 0921 3     Pain Loc --      Pain Edu? --      Excl. in GC? --     Constitutional: Alert and oriented. Well appearing and in no acute distress. Eyes: Conjunctivae are normal. PERRL. EOMI. Head: Atraumatic. Nose: No congestion/rhinnorhea. Mouth/Throat: Mucous membranes are Slightly dry.  Oropharynx non-erythematous. Neck: No stridor.   Nontender with no meningismus Cardiovascular: Normal rate, regular rhythm. Grossly  normal heart sounds.  Good peripheral circulation. Respiratory: Normal respiratory effort.  No retractions. Lungs CTAB. Abdominal: Soft and nontender. No distention. No guarding no rebound Back:  There is no focal tenderness or step off.  there is no midline tenderness there are no lesions noted. there is no CVA tenderness Musculoskeletal: No lower extremity tenderness, no upper extremity tenderness. No joint effusions, no DVT signs strong distal pulses no edema Neurologic:  Normal speech and language. No gross focal neurologic deficits are appreciated.  Skin:  Skin is warm, dry and intact. No rash  noted. Psychiatric: Mood and affect are anxious. Speech and behavior are normal.  ____________________________________________   LABS (all labs ordered are listed, but only abnormal results are displayed)  Labs Reviewed  CBC WITH DIFFERENTIAL/PLATELET - Abnormal; Notable for the following:       Result Value   Neutro Abs 6.9 (*)    All other components within normal limits  COMPREHENSIVE METABOLIC PANEL - Abnormal; Notable for the following:    CO2 21 (*)    All other components within normal limits  ETHANOL  LIPASE, BLOOD  MAGNESIUM   ____________________________________________  EKG  I personally interpreted any EKGs ordered by me or triage Sinus rhythm, no acute ST elevation or depression normal axis, no acute ischemic changes. ____________________________________________  RADIOLOGY  I reviewed any imaging ordered by me or triage that were performed during my shift and, if possible, patient and/or family made aware of any abnormal findings. ____________________________________________   PROCEDURES  Procedure(s) performed: None  Procedures  Critical Care performed: None  ____________________________________________   INITIAL IMPRESSION / ASSESSMENT AND PLAN / ED COURSE  Pertinent labs & imaging results that were available during my care of the patient were reviewed by me and considered in my medical decision making (see chart for details).  Patient here with resolving GI symptoms. Family member had similar. He feels that this is a viral syndrome but he states he is getting anxious and having tingling and he is worried that something S might be going on. His abdomen is completely benign. Vital signs are reassuring. We will check basic blood work including magnesium and electrolytes and we will give him IV fluid and Phenergan.  ----------------------------------------- 11:14 AM on 08/31/2016 -----------------------------------------  Serial abdominal exams  showed no evidence of abdominal discomfort, patient is feeling much better after some IV fluids we'll continue the IV fluid. Reassured thus far his findings. Blood work very reassuring. Alcohol negative, renal function normal, white count normal, electrolyte reassuring. Magnesium normal. I feel patient likely will be safe for discharge same. He has been tolerating by mouth for the last several days. As for his tingling, there is nothing to suggest acute CVA or metabolic mediated systemic event, this is universal tingling associated with hyperventilation and anxiety, and do not think a CT scan of his head is indicated nor do I think that there is any likelihood of any spinal lesion such as transverse myelitis etc. He has a normal neurologic exam he has an intermittent bouts assisted with hyperventilation. Return precautions however very clearly explained to the patient's as isfollow-up  Clinical Course    ____________________________________________   FINAL CLINICAL IMPRESSION(S) / ED DIAGNOSES  Final diagnoses:  None      This chart was dictated using voice recognition software.  Despite best efforts to proofread,  errors can occur which can change meaning.      Jeanmarie PlantJames A Kavya Haag, MD 08/31/16 1116    Jeanmarie PlantJames A Keyli Duross, MD 08/31/16  1116  

## 2016-08-31 NOTE — ED Triage Notes (Signed)
Patient to ED via POV with c/o abd pain, N/V, and weakness. Patient states that his symptoms started on Monday. Patient reports that last night he had difficulty staying awake, this morning patient felt weak and felt like there were pins and needles in his arms and leg. Per patient report he last vomited on Thursday.

## 2016-10-09 ENCOUNTER — Emergency Department: Payer: Medicare Other

## 2016-10-09 ENCOUNTER — Emergency Department
Admission: EM | Admit: 2016-10-09 | Discharge: 2016-10-09 | Disposition: A | Discharge status: Home / Self Care | Payer: Medicare Other | Attending: Emergency Medicine | Admitting: Emergency Medicine

## 2016-10-09 ENCOUNTER — Encounter: Payer: Self-pay | Admitting: *Deleted

## 2016-10-09 DIAGNOSIS — R509 Fever, unspecified: Secondary | ICD-10-CM | POA: Diagnosis present

## 2016-10-09 DIAGNOSIS — Z87891 Personal history of nicotine dependence: Secondary | ICD-10-CM | POA: Diagnosis not present

## 2016-10-09 DIAGNOSIS — J069 Acute upper respiratory infection, unspecified: Secondary | ICD-10-CM | POA: Diagnosis not present

## 2016-10-09 IMAGING — CT CT MAXILLOFACIAL W/O CM
3 series · 16 of 47 positions shown, 19 images · non-contrast
Comparison: Head CT [DATE]

CLINICAL DATA: Facial and sinus pain. Bilateral earache for 1
month.

EXAM:
CT MAXILLOFACIAL WITHOUT CONTRAST
TECHNIQUE: Multidetector CT imaging of the maxillofacial structures was
performed. Multiplanar CT image reconstructions were also generated.
A small metallic BB was placed on the right temple in order to
reliably differentiate right from left.

[Series 2: max soft · axial · 0.38mm/px · z∈[-231,-79]mm · 10 of 90 slices shown, 13 images]
[im 7/90  brain]
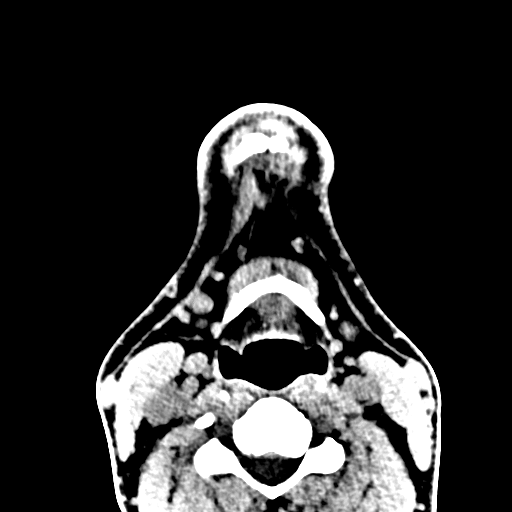
[im 7/90  bone]
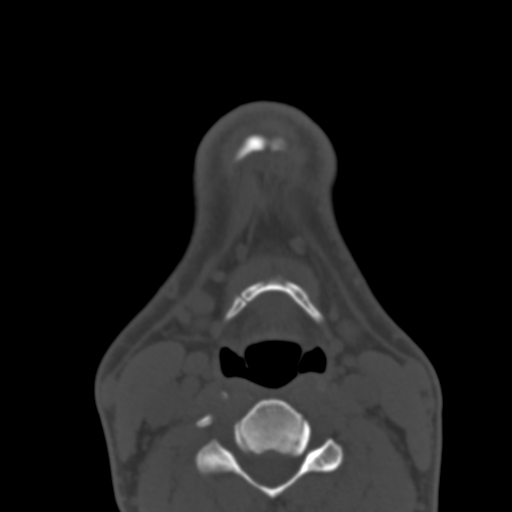
[im 16/90  bone]
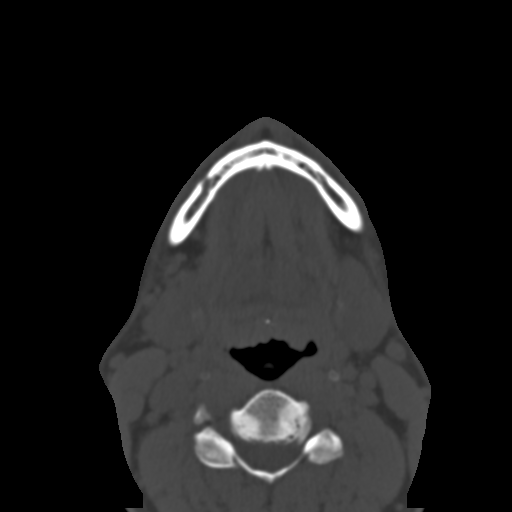
[im 25/90  bone]
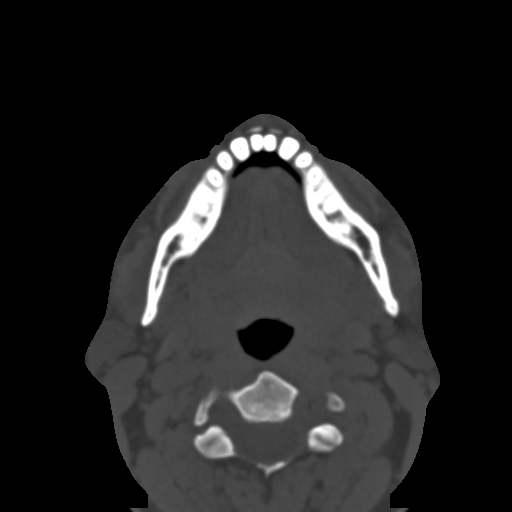
[im 31/90  bone]
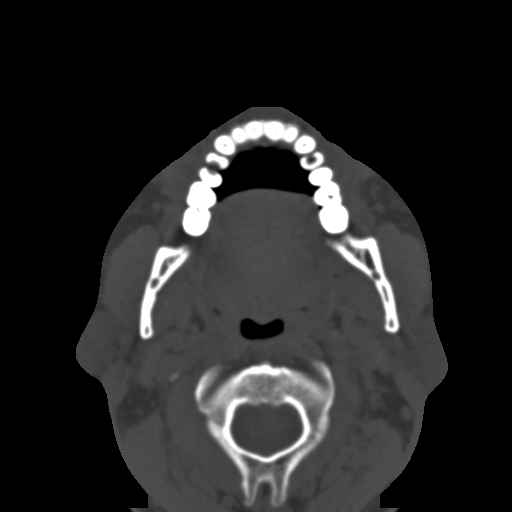
[im 40/90  brain]
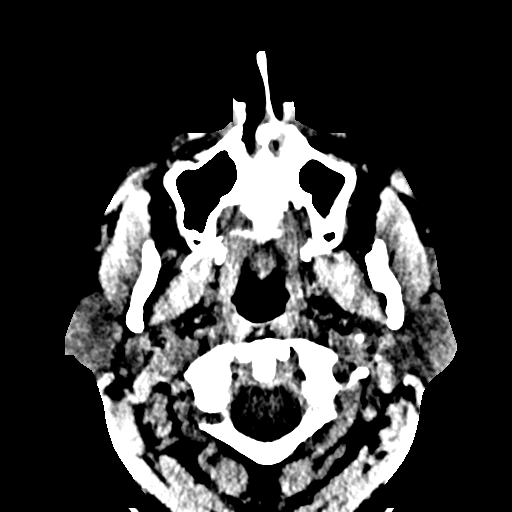
[im 40/90  bone]
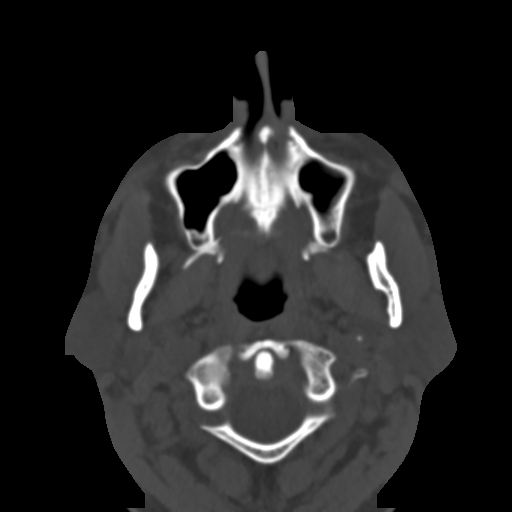
[im 50/90  bone]
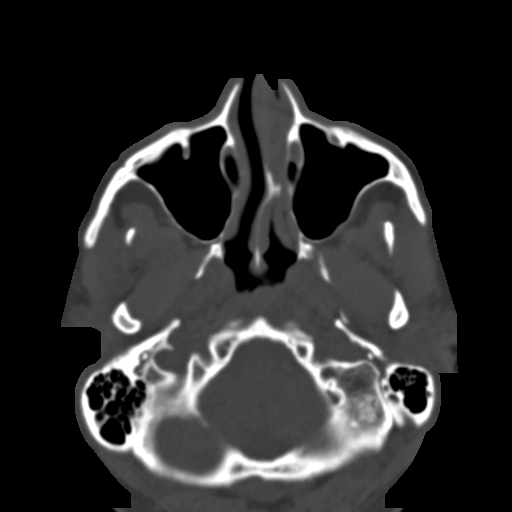
[im 59/90  bone]
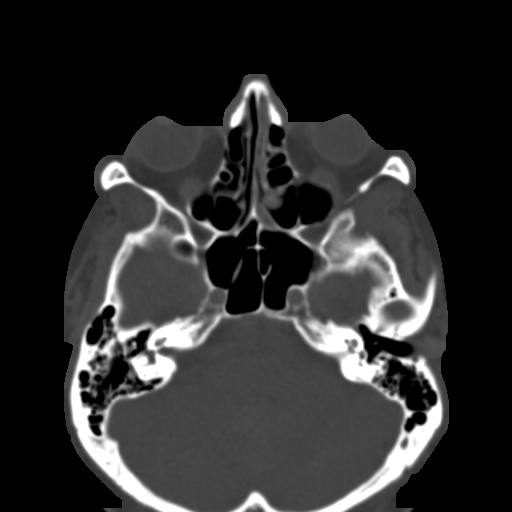
[im 68/90  bone]
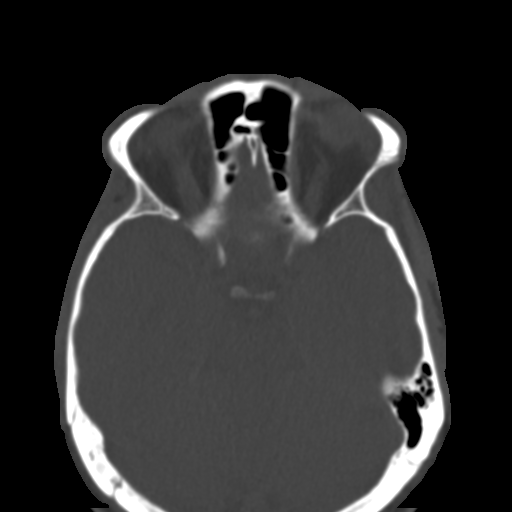
[im 74/90  brain]
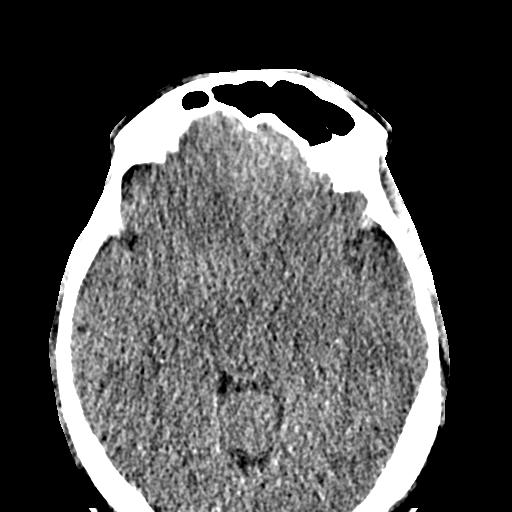
[im 74/90  bone]
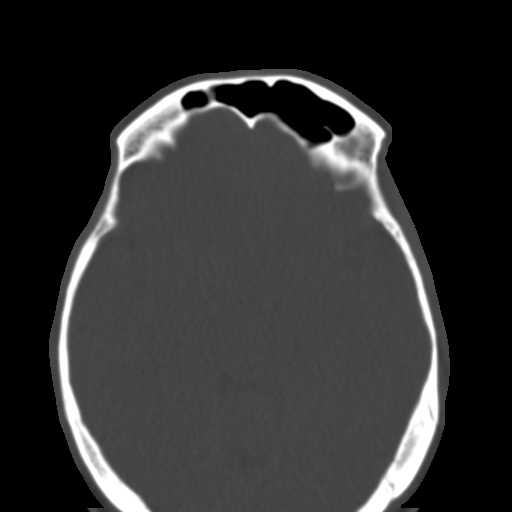
[im 83/90  bone]
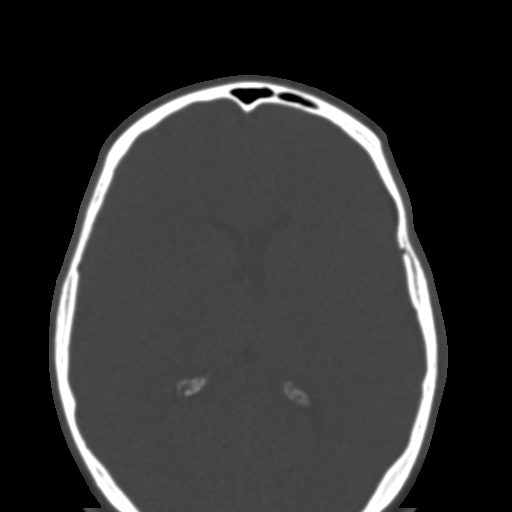

[Series 6: coronal soft · coronal · 0.38mm/px · 3 of 81 slices shown]
[im 27/81  bone]
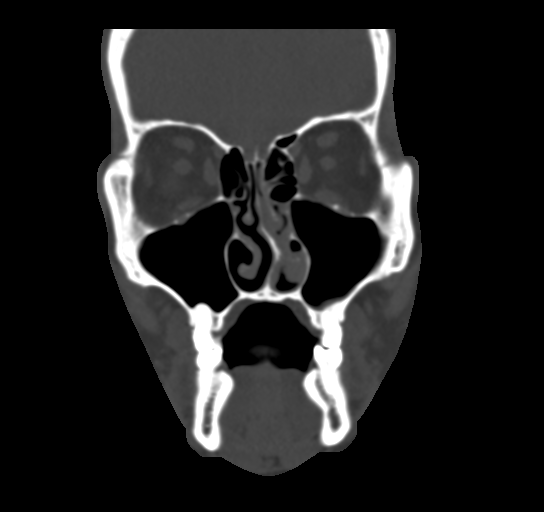
[im 36/81  bone]
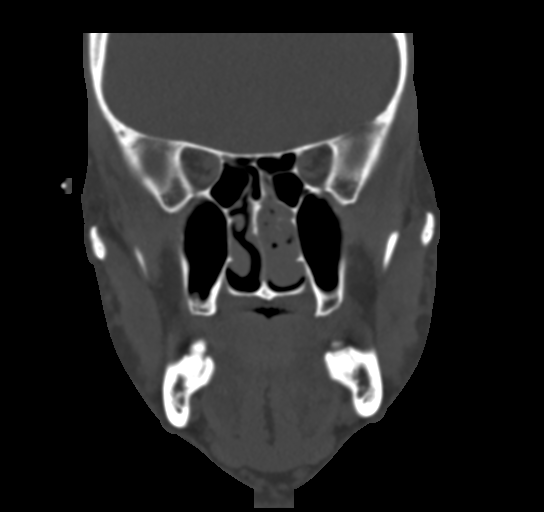
[im 45/81  bone]
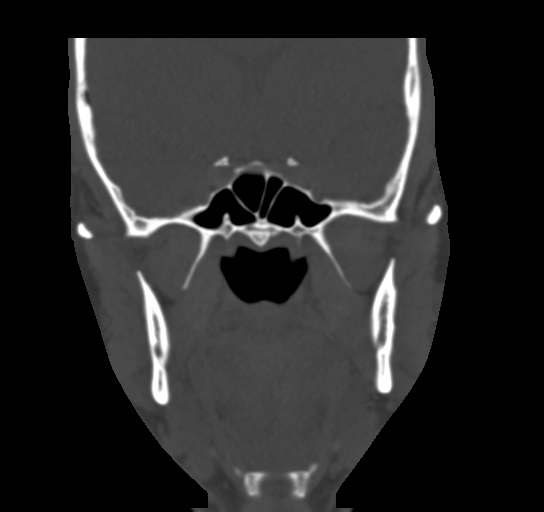

[Series 7: sagittal soft · sagittal · 0.36mm/px · 3 of 80 slices shown]
[im 27/80  bone]
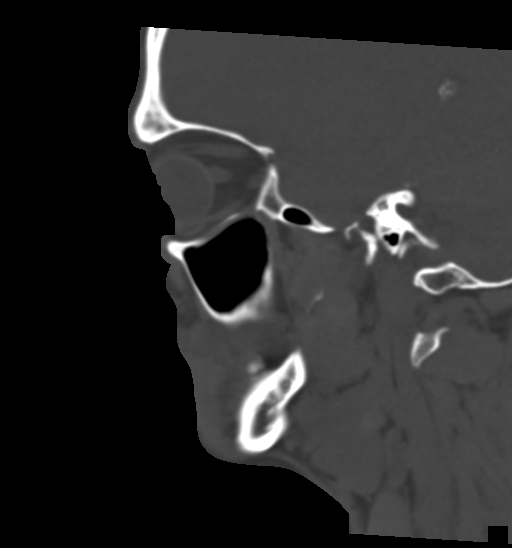
[im 40/80  bone]
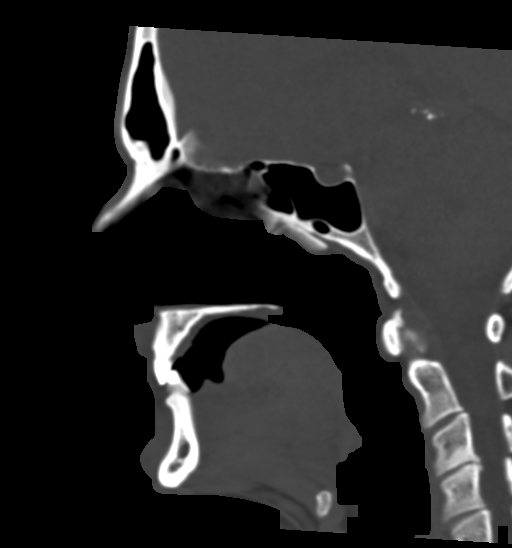
[im 53/80  bone]
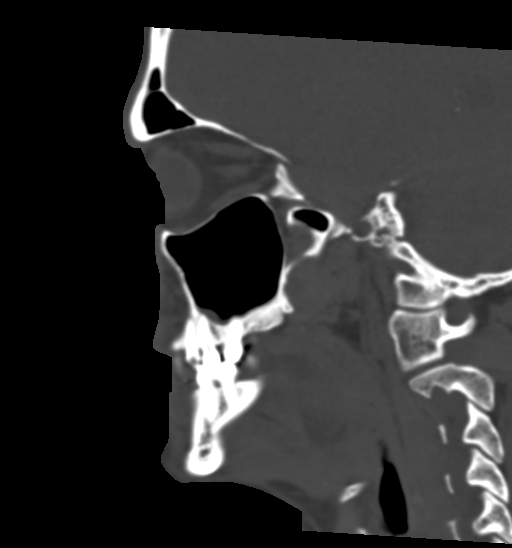

[16 of 47 positions shown; findings below may reference images not displayed]

FINDINGS: Osseous: No fracture or mandibular dislocation. No destructive
process.

Orbits: Negative. No traumatic or inflammatory finding.

Sinuses: Clear.

Soft tissues: Negative.

Limited intracranial: No significant or unexpected finding.
IMPRESSION: Unremarkable maxillofacial CT. No findings for the patient's facial
pain and sinus pressure.

## 2016-10-09 IMAGING — CR DG CHEST 2V
2 series · 2 of 2 positions shown · non-contrast
Comparison: None.

CLINICAL DATA: Flu like symptoms for 1 month.

EXAM:
CHEST  2 VIEW

[chest pa]
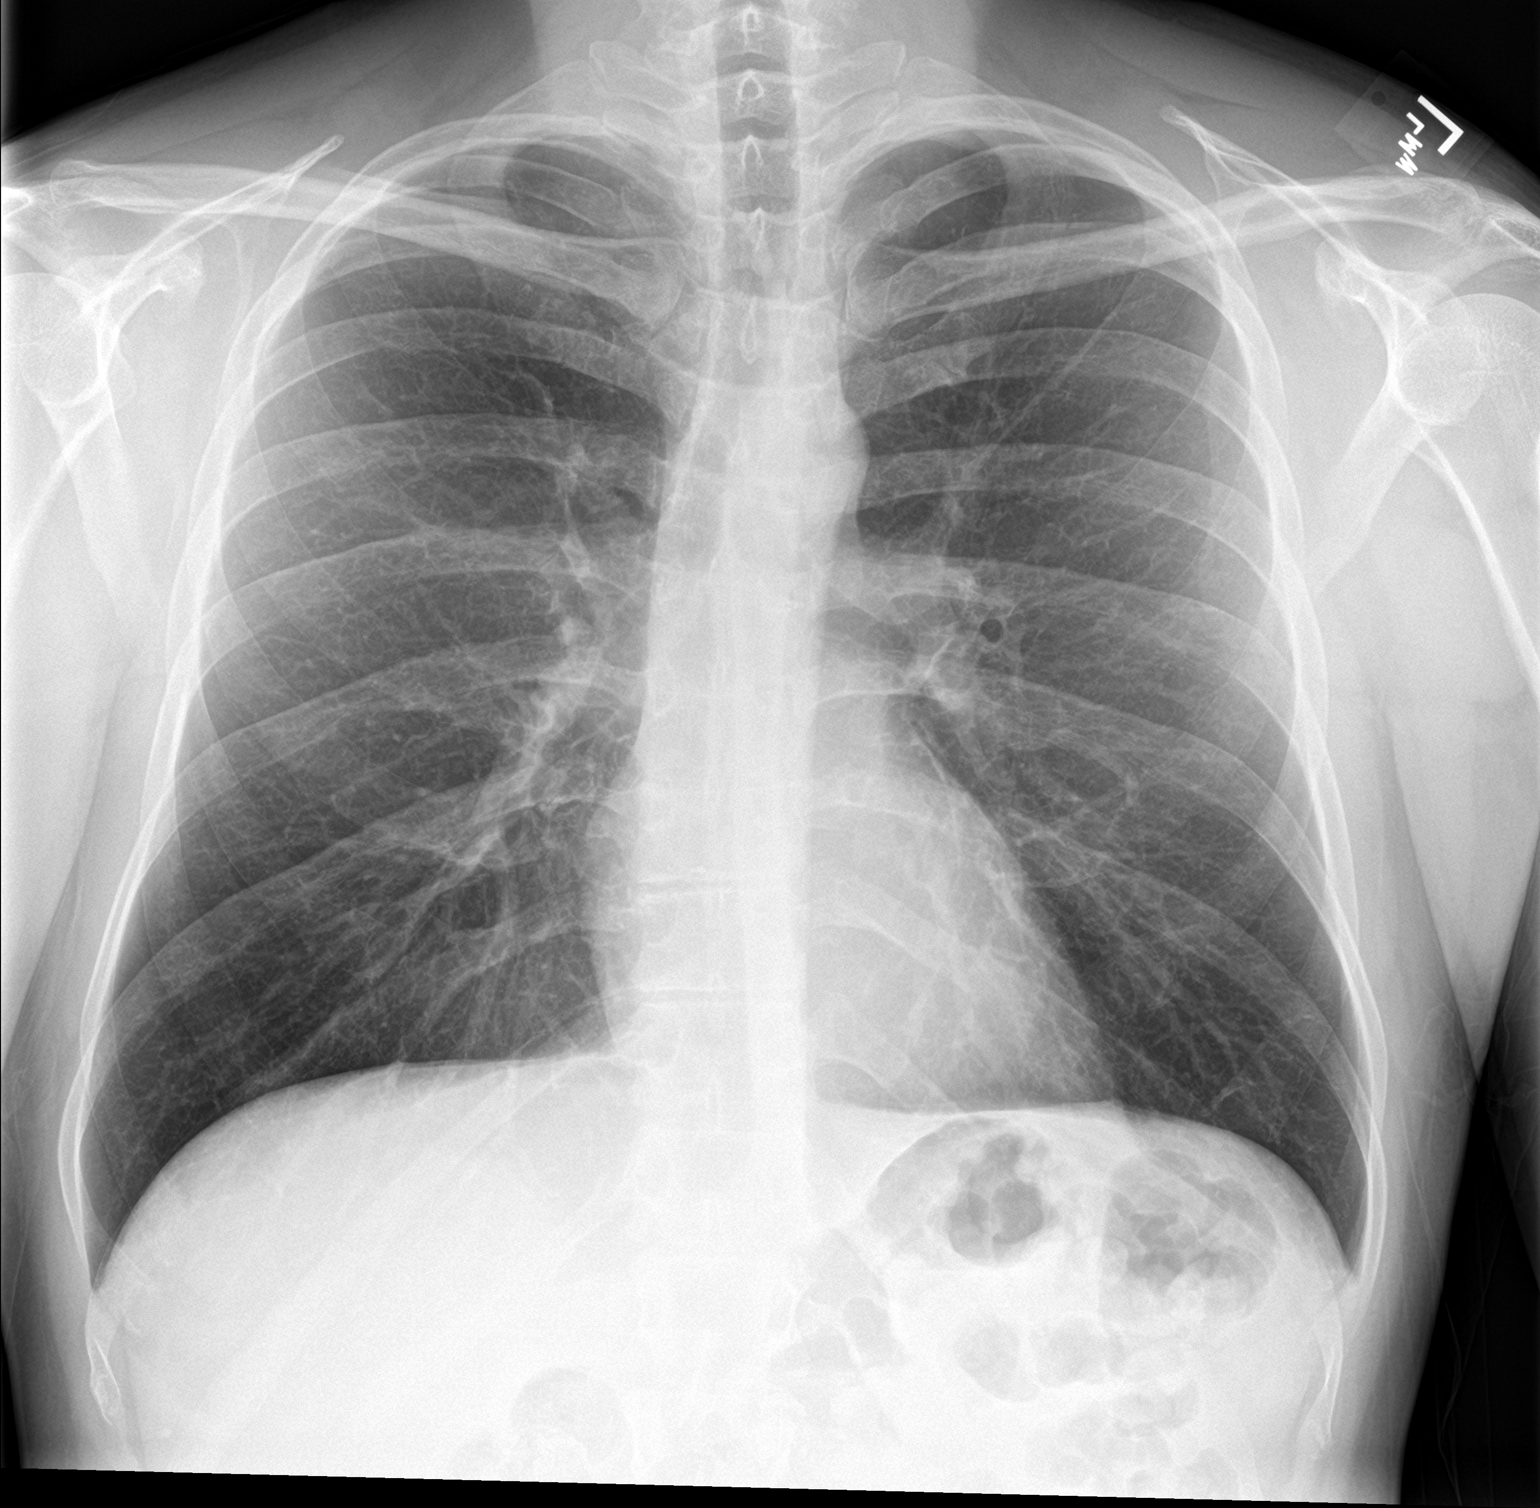

[chest lat]
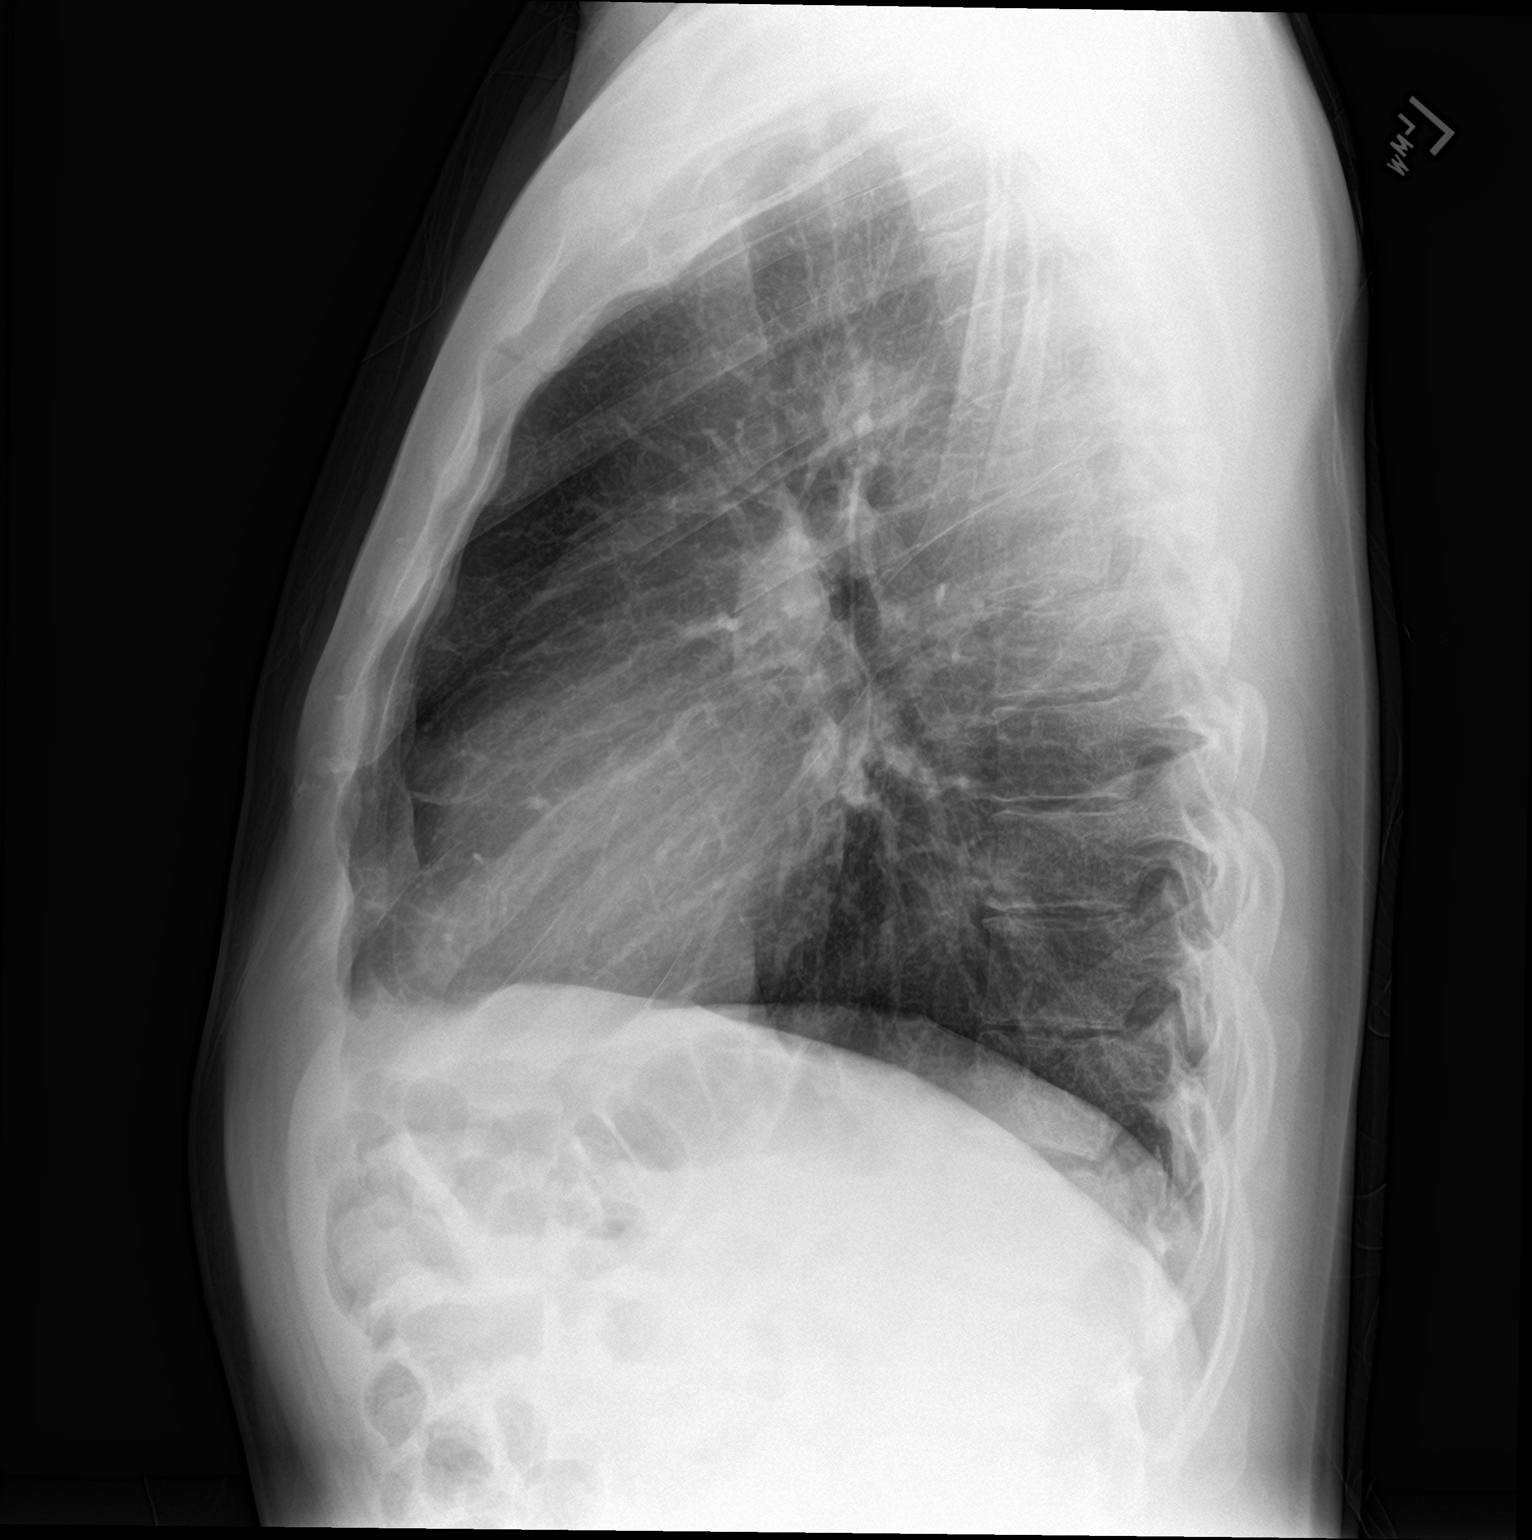

[2 of 2 positions shown; findings below may reference images not displayed]

FINDINGS: The heart size and mediastinal contours are within normal limits.
Both lungs are clear. The visualized skeletal structures are
unremarkable.
IMPRESSION: No active cardiopulmonary disease.

## 2016-10-09 MED ORDER — AZITHROMYCIN 250 MG PO TABS
ORAL_TABLET | ORAL | 0 refills | Status: DC
Start: 2016-10-09 — End: 2021-12-19

## 2016-10-09 MED ORDER — NAPROXEN 500 MG PO TABS: 500.0000 mg | ORAL_TABLET | Freq: Two times a day (BID) | ORAL | 0 refills | Status: DC

## 2016-10-09 MED ORDER — GUAIFENESIN-CODEINE 100-10 MG/5ML PO SOLN: 10.0000 mL | ORAL | 0 refills | Status: DC | PRN

## 2016-10-09 NOTE — ED Notes (Signed)
Face and ear pain for 1 month.

## 2016-10-09 NOTE — ED Triage Notes (Signed)
Pt has facial pain and sinus pressure and bil earache for 1 month.  No sore throat.  Pt alert.  Speech clear.

## 2016-10-09 NOTE — ED Provider Notes (Signed)
[facility] Emergency Department Provider Note   ____________________________________________   None    (approximate)  I have reviewed the triage vital signs and the nursing notes.   HISTORY  Chief Complaint Facial Pain and Otalgia    HPI Jay Mcneil is a 46 y.o. male who presents with severe facial pain and sinus pressure and bilateral ear pain for 1 month. Patient states fever and chills on and off. States he ended up with an oral virus last month and has been having problems ever since.   Past Medical History:  Diagnosis Date  . Bipolar disorder (HCC)   . Crohn's disease (HCC)     There are no active problems to display for this patient.   Past Surgical History:  Procedure Laterality Date  . perianal surgery      Prior to Admission medications   Medication Sig Start Date End Date Taking? Authorizing Provider  azithromycin (ZITHROMAX Z-PAK) 250 MG tablet Take 2 tablets (500 mg) on  Day 1,  followed by 1 tablet (250 mg) once daily on Days 2 through 5. 10/09/16   Evangeline Dakinharles M Duy Lemming, PA-C  guaiFENesin-codeine 100-10 MG/5ML syrup Take 10 mLs by mouth every 4 (four) hours as needed for cough. 10/09/16   Evangeline Dakinharles M Deaglan Lile, PA-C  naproxen (NAPROSYN) 500 MG tablet Take 1 tablet (500 mg total) by mouth 2 (two) times daily with a meal. 10/09/16   Evangeline Dakinharles M Doyne Micke, PA-C  promethazine (PHENERGAN) 12.5 MG tablet Take 1 tablet (12.5 mg total) by mouth every 6 (six) hours as needed for nausea or vomiting. 08/31/16   Jeanmarie PlantJames A McShane, MD    Allergies Erythromycin; Penicillins; and Sulfa antibiotics  No family history on file.  Social History Social History  Substance Use Topics  . Smoking status: Former Games developermoker  . Smokeless tobacco: Never Used  . Alcohol use Yes     Comment: occ    Review of Systems Constitutional: Reports positive for fever/chills Eyes: No visual changes. ENT: Minimal sore throat. Cardiovascular: Denies chest pain. Respiratory:  Denies shortness of breath. Positive for productive cough. Gastrointestinal: No abdominal pain.  No nausea, no vomiting.  No diarrhea.  No constipation. Genitourinary: Negative for dysuria. Musculoskeletal: Negative for back pain. Skin: Negative for rash. Neurological: Negative for headaches, focal weakness or numbness.  10-point ROS otherwise negative.  ____________________________________________   PHYSICAL EXAM:  VITAL SIGNS: ED Triage Vitals [10/09/16 1935]  Enc Vitals Group     BP (!) 138/93     Pulse Rate 86     Resp 18     Temp 98 F (36.7 C)     Temp Source Oral     SpO2 98 %     Weight 170 lb (77.1 kg)     Height 6' (1.829 m)     Head Circumference      Peak Flow      Pain Score 8     Pain Loc      Pain Edu?      Excl. in GC?     Constitutional: Alert and oriented. Well appearing and in no acute distress. Head: Atraumatic. Nose: No congestion/rhinnorhea. Mouth/Throat: Mucous membranes are moist.  Oropharynx non-erythematous. Neck: No stridor. Supple, full range of motion. Positive for maxillary tenderness bilaterally.  Cardiovascular: Normal rate, regular rhythm. Grossly normal heart sounds.  Good peripheral circulation. Respiratory: Normal respiratory effort.  No retractions. Lungs CTAB. Breath sounds coarse bilaterally. Gastrointestinal: Soft and nontender. No distention. No abdominal bruits. No CVA  tenderness. Musculoskeletal: No lower extremity tenderness nor edema.  No joint effusions. Neurologic:  Normal speech and language. No gross focal neurologic deficits are appreciated. No gait instability. Skin:  Skin is warm, dry and intact. No rash noted. Psychiatric: Mood and affect are normal. Speech and behavior are normal.  ____________________________________________   LABS (all labs ordered are listed, but only abnormal results are displayed)  Labs Reviewed - No data to  display ____________________________________________  EKG   ____________________________________________  RADIOLOGY   ____________________________________________   PROCEDURES  Procedure(s) performed: None  Procedures  Critical Care performed: No  ____________________________________________   INITIAL IMPRESSION / ASSESSMENT AND PLAN / ED COURSE  Pertinent labs & imaging results that were available during my care of the patient were reviewed by me and considered in my medical decision making (see chart for details).  Upper respiratory tract infection. Rx given for Zithromax, Robitussin-AC and Naprosyn. Patient follow-up PCP or return to ER with any worsening symptomology. Patient voices no other emergency medical complaints this time.      ____________________________________________   FINAL CLINICAL IMPRESSION(S) / ED DIAGNOSES  Final diagnoses:  Upper respiratory tract infection, unspecified type      NEW MEDICATIONS STARTED DURING THIS VISIT:  New Prescriptions   AZITHROMYCIN (ZITHROMAX Z-PAK) 250 MG TABLET    Take 2 tablets (500 mg) on  Day 1,  followed by 1 tablet (250 mg) once daily on Days 2 through 5.   GUAIFENESIN-CODEINE 100-10 MG/5ML SYRUP    Take 10 mLs by mouth every 4 (four) hours as needed for cough.   NAPROXEN (NAPROSYN) 500 MG TABLET    Take 1 tablet (500 mg total) by mouth 2 (two) times daily with a meal.     Note:  This document was prepared using Dragon voice recognition software and may include unintentional dictation errors.   Evangeline Dakin, PA-C 10/09/16 2130    Phineas Semen, MD 10/09/16 2228

## 2017-07-17 ENCOUNTER — Emergency Department: Payer: Medicare Other

## 2017-07-17 ENCOUNTER — Emergency Department
Admission: EM | Admit: 2017-07-17 | Discharge: 2017-07-17 | Disposition: A | Discharge status: Home / Self Care | Payer: Medicare Other | Attending: Emergency Medicine | Admitting: Emergency Medicine

## 2017-07-17 DIAGNOSIS — Z87891 Personal history of nicotine dependence: Secondary | ICD-10-CM | POA: Insufficient documentation

## 2017-07-17 DIAGNOSIS — R0789 Other chest pain: Secondary | ICD-10-CM | POA: Diagnosis not present

## 2017-07-17 DIAGNOSIS — R079 Chest pain, unspecified: Secondary | ICD-10-CM | POA: Diagnosis present

## 2017-07-17 LAB — BASIC METABOLIC PANEL
Anion gap: 7 (ref 5–15)
BUN: 13 mg/dL (ref 6–20)
CHLORIDE: 111 mmol/L (ref 101–111)
CO2: 23 mmol/L (ref 22–32)
CREATININE: 0.89 mg/dL (ref 0.61–1.24)
Calcium: 9.6 mg/dL (ref 8.9–10.3)
GFR calc Af Amer: >60 mL/min (ref >60)
GFR calc non Af Amer: >60 mL/min (ref >60)
Glucose, Bld: 106 mg/dL — ABNORMAL HIGH (ref 65–99)
Potassium: 3.5 mmol/L (ref 3.5–5.1)
SODIUM: 141 mmol/L (ref 135–145)

## 2017-07-17 LAB — CBC
HCT: 40.9 % (ref 40.0–52.0)
Hemoglobin: 14.4 g/dL (ref 13.0–18.0)
MCH: 30.5 pg (ref 26.0–34.0)
MCHC: 35.3 g/dL (ref 32.0–36.0)
MCV: 86.3 fL (ref 80.0–100.0)
PLATELETS: 239 10*3/uL (ref 150–440)
RBC: 4.74 MIL/uL (ref 4.40–5.90)
RDW: 12.6 % (ref 11.5–14.5)
WBC: 11.5 10*3/uL — AB (ref 3.8–10.6)

## 2017-07-17 LAB — TROPONIN I
Troponin I: <0.03 ng/mL (ref <0.03)
Troponin I: <0.03 ng/mL (ref <0.03)

## 2017-07-17 IMAGING — CR DG CHEST 2V
2 series · 2 of 2 positions shown · non-contrast
Comparison: [DATE]

CLINICAL DATA: Left-sided chest pain radiating to the left arm and
leg, chronic but worsening today.

EXAM:
CHEST  2 VIEW

[chest pa]
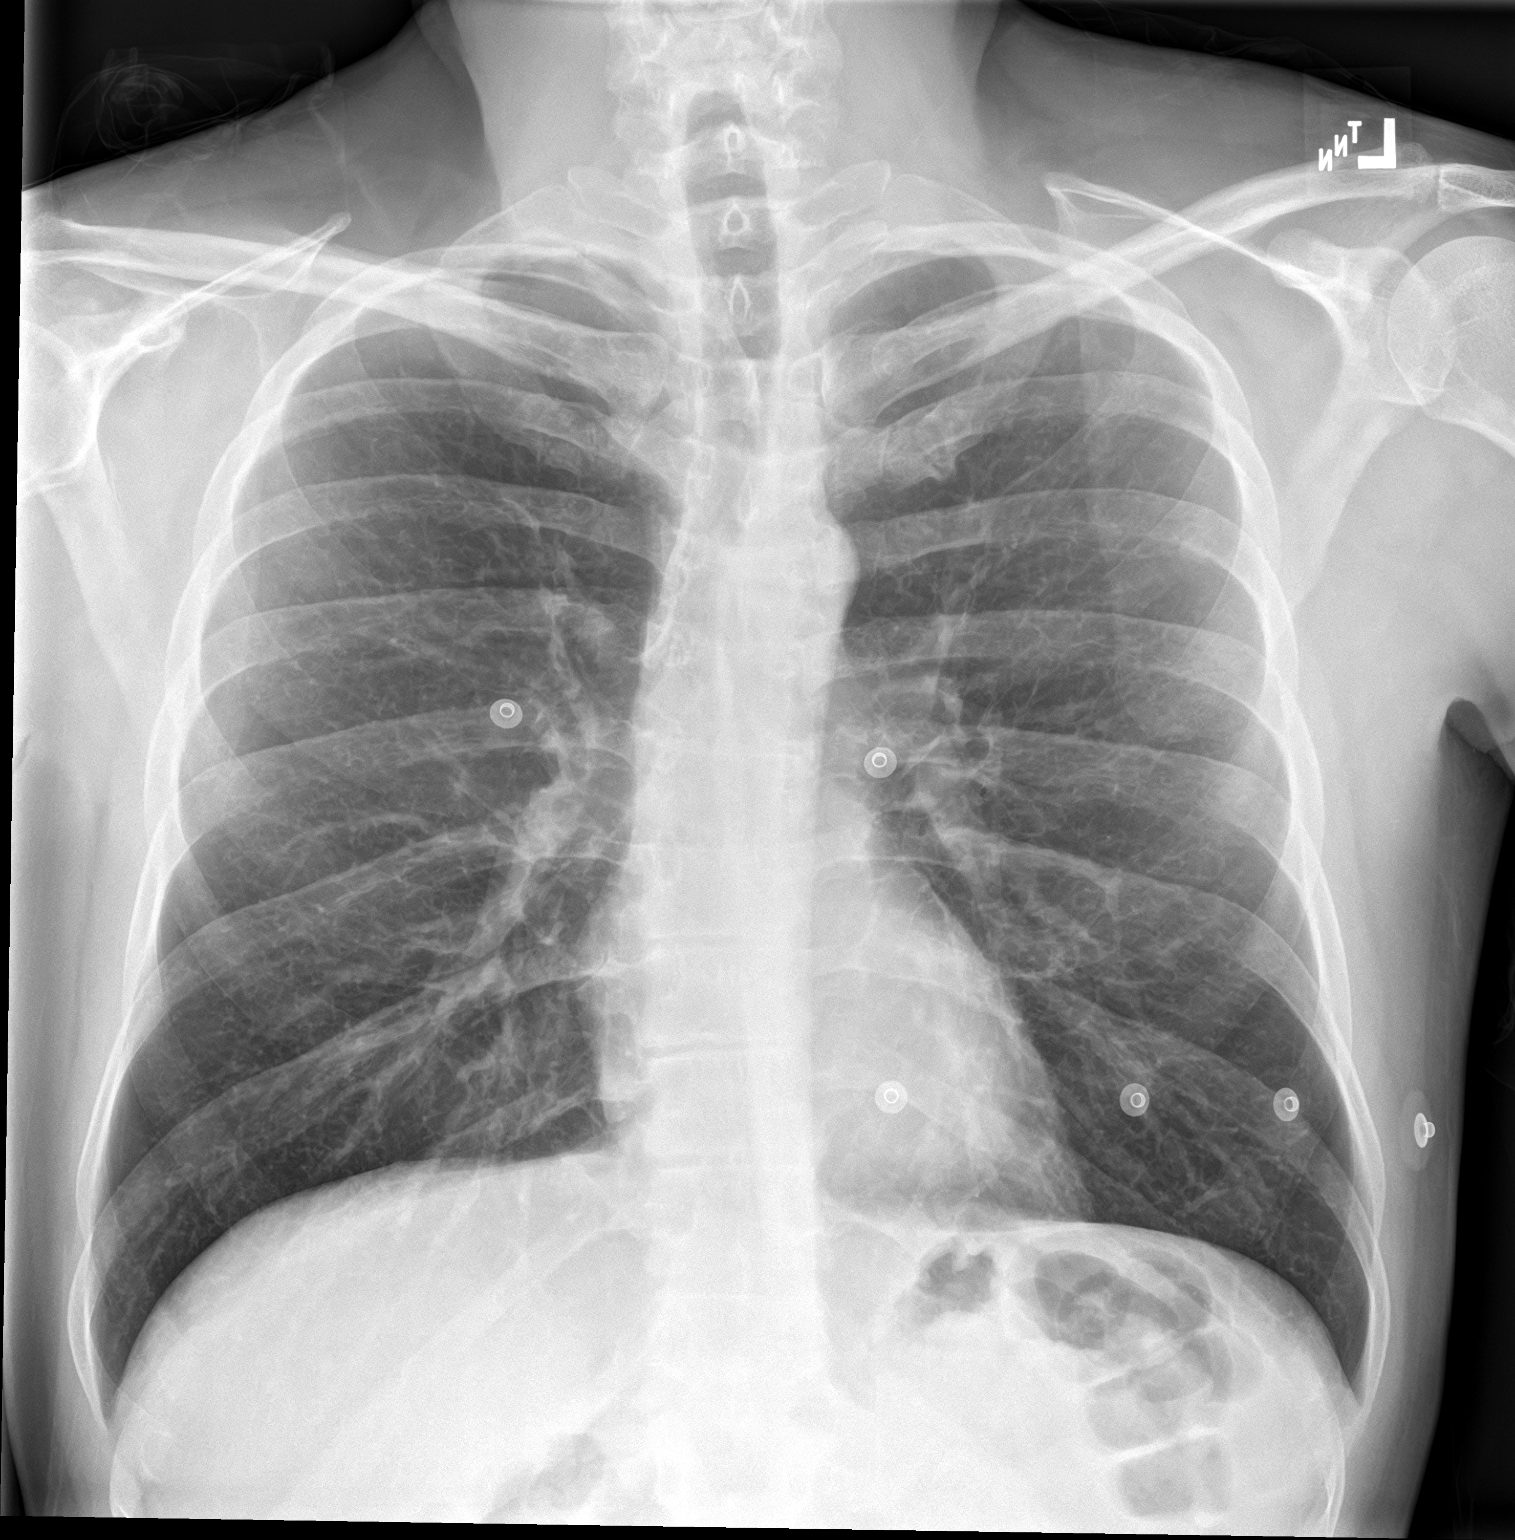

[chest lat]
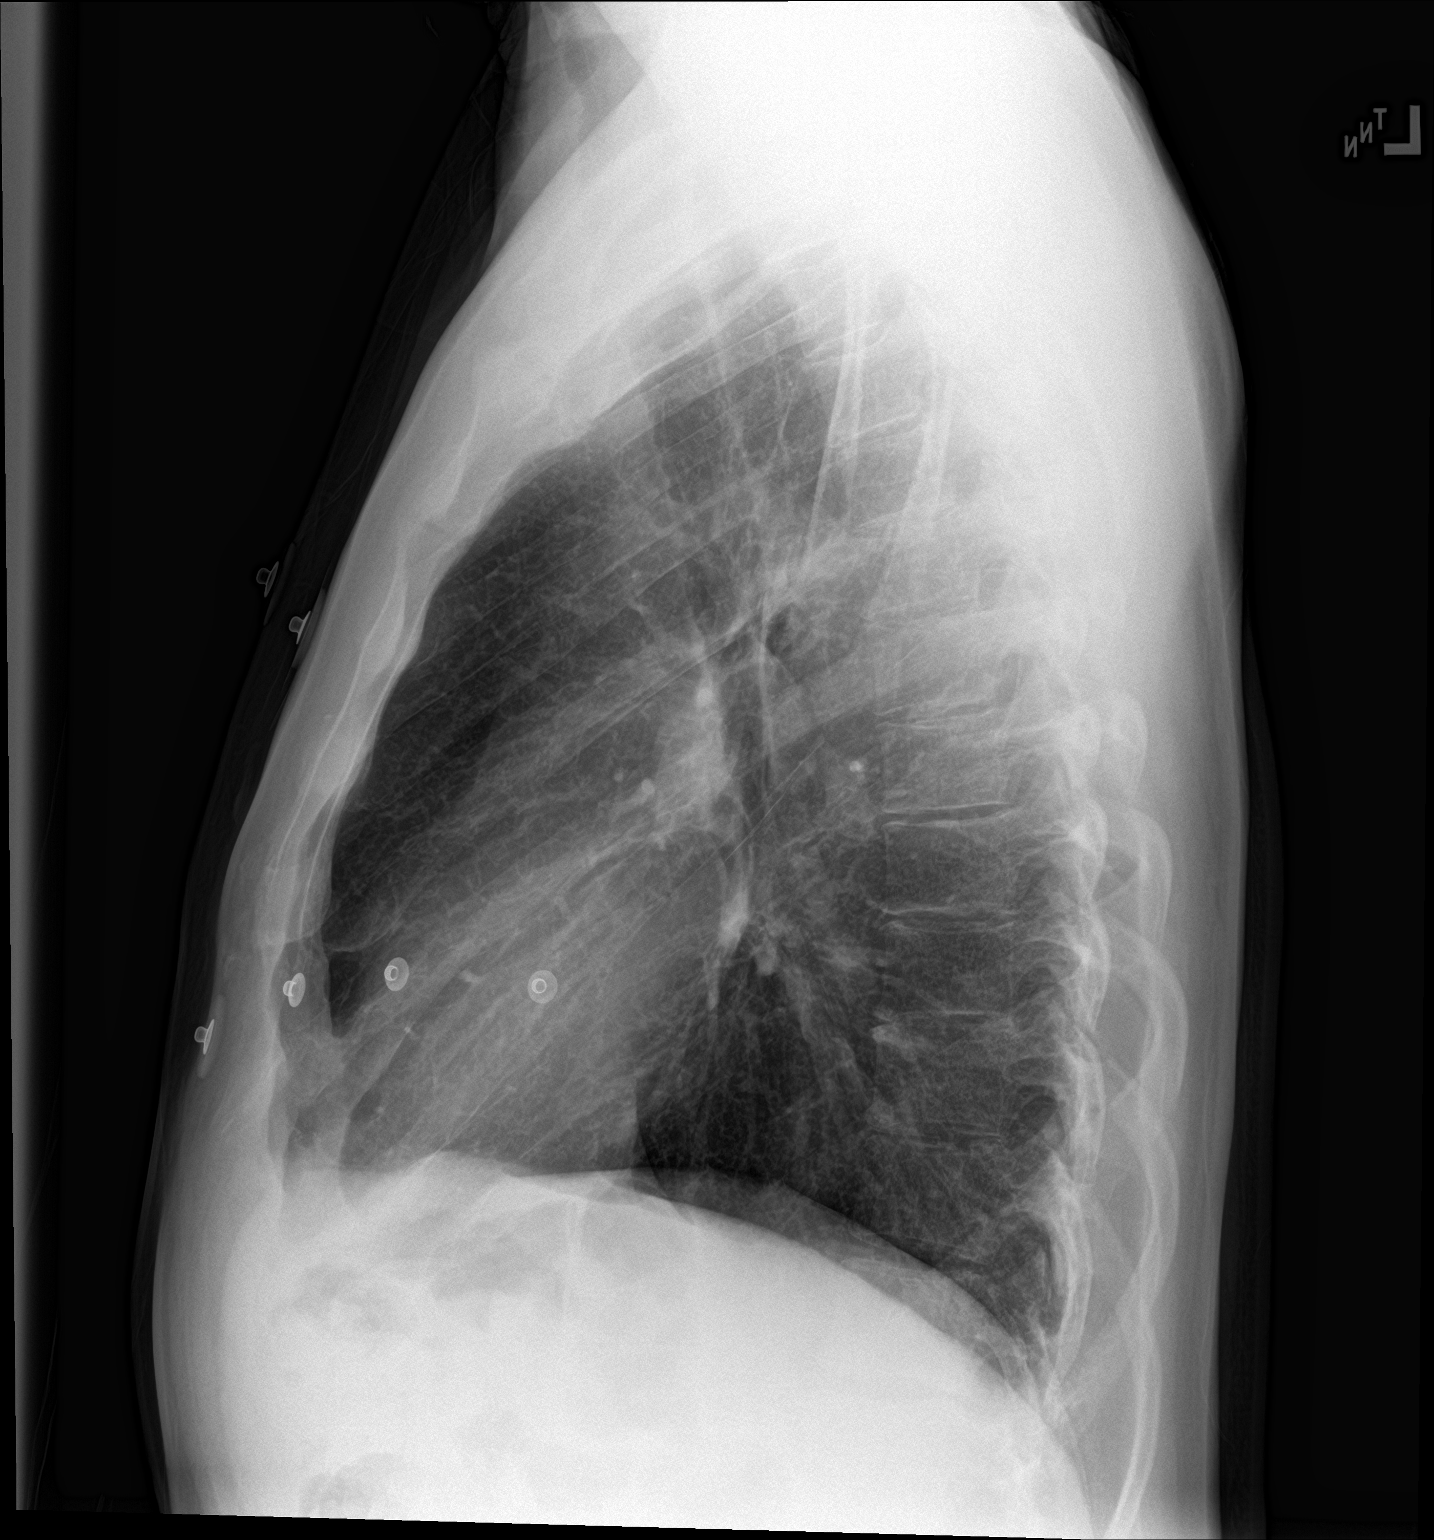

[2 of 2 positions shown; findings below may reference images not displayed]

FINDINGS: The heart size and mediastinal contours are within normal limits.
Both lungs are clear. The visualized skeletal structures are
unremarkable.
IMPRESSION: No active cardiopulmonary disease.

## 2017-07-17 NOTE — ED Provider Notes (Signed)
[facility] Emergency Department Provider Note ____________________________________________   I have reviewed the triage vital signs and the triage nursing note.  HISTORY  Chief Complaint Chest Pain   Historian Patient  HPI Jay Mcneil is a 46 y.o. male with a history of Crohn's disease with fistulas as well as bipolar, presents for episode of heart pounding, prior to arrival arrived by EMS.  Patient states that he is being evaluated by his primary care doctor and pulmonologist for possible sarcoid.  He states that he has inflammation that comes and goes especially on the left side of his neck and occasionally gives him shortness of breath.  He has intermittent coughing up black specks.  He has intermittent hand and leg burning.  He states that he was at rest today when both base of the thumbs felt like gasoline had been poured on them and then that went away and then his left groin down to his foot felt like it was on fire and then that went away and then he had left-sided heart pounding, stronger than his ever felt before.  He really did not call this chest pain.  Is not having any shortness of breath or trouble breathing.  He states that he has night sweats.  He states that he has seen a pulmonologist and had normal spirometry and is referred to a rheumatologist right now and awaiting that appointment.  Dates that if he had known the discomfort would go away within 10 minutes he probably would have come here, but it was worse than ever had before.  States that he has been worked up at [facility], and has had a chest CT.  States he is awaiting PET scan, because that is what showed that his sibling had sarcoid.   Past Medical History:  Diagnosis Date  . Bipolar disorder (HCC)   . Crohn's disease (HCC)     There are no active problems to display for this patient.   Past Surgical History:  Procedure Laterality Date  . perianal surgery      Prior to  Admission medications   Medication Sig Start Date End Date Taking? Authorizing Provider  azithromycin (ZITHROMAX Z-PAK) 250 MG tablet Take 2 tablets (500 mg) on  Day 1,  followed by 1 tablet (250 mg) once daily on Days 2 through 5. 10/09/16   Beers, Charmayne Sheer, PA-C  guaiFENesin-codeine 100-10 MG/5ML syrup Take 10 mLs by mouth every 4 (four) hours as needed for cough. 10/09/16   Beers, Charmayne Sheer, PA-C  naproxen (NAPROSYN) 500 MG tablet Take 1 tablet (500 mg total) by mouth 2 (two) times daily with a meal. 10/09/16   Beers, Charmayne Sheer, PA-C  promethazine (PHENERGAN) 12.5 MG tablet Take 1 tablet (12.5 mg total) by mouth every 6 (six) hours as needed for nausea or vomiting. 08/31/16   Jeanmarie Plant, MD    Allergies  Allergen Reactions  . Erythromycin   . Penicillins   . Sulfa Antibiotics     No family history on file.  Social History Social History  Substance Use Topics  . Smoking status: Former Games developer  . Smokeless tobacco: Never Used  . Alcohol use Yes     Comment: occ    Review of Systems  Constitutional: Negative for fever. Eyes: Negative for visual changes. ENT: Negative for sore throat. Cardiovascular: Positive for chest pounding, currently gone. Respiratory: Positive for intermittent shortness of breath. Gastrointestinal: Negative for abdominal pain, vomiting and diarrhea. Genitourinary: Negative for dysuria. Musculoskeletal:  Negative for back pain. Skin: Negative for rash. Neurological: Negative for headache.  ____________________________________________   PHYSICAL EXAM:  VITAL SIGNS: ED Triage Vitals  Enc Vitals Group     BP 07/17/17 1256 (!) 143/70     Pulse Rate 07/17/17 1256 98     Resp 07/17/17 1256 18     Temp 07/17/17 1256 98.6 F (37 C)     Temp Source 07/17/17 1256 Oral     SpO2 07/17/17 1256 99 %     Weight 07/17/17 1304 160 lb (72.6 kg)     Height 07/17/17 1304 5\' 8"  (1.727 m)     Head Circumference --      Peak Flow --      Pain Score --       Pain Loc --      Pain Edu? --      Excl. in GC? --      Constitutional: Alert and oriented. Well appearing and in no distress. HEENT   Head: Normocephalic and atraumatic.      Eyes: Conjunctivae are normal. Pupils equal and round.       Ears:         Nose: No congestion/rhinnorhea.   Mouth/Throat: Mucous membranes are moist.   Neck: No stridor. Cardiovascular/Chest: Normal rate, regular rhythm.  No murmurs, rubs, or gallops. Respiratory: Normal respiratory effort without tachypnea nor retractions. Breath sounds are clear and equal bilaterally. No wheezes/rales/rhonchi. Gastrointestinal: Soft. No distention, no guarding, no rebound. Nontender.    Genitourinary/rectal:Deferred Musculoskeletal: Nontender with normal range of motion in all extremities. No joint effusions.  No lower extremity tenderness.  No edema. Neurologic:  Normal speech and language. No gross or focal neurologic deficits are appreciated. Skin:  Skin is warm, dry and intact. No rash noted. Psychiatric: Mood and affect are normal. Speech and behavior are normal. Patient exhibits appropriate insight and judgment.   ____________________________________________  LABS (pertinent positives/negatives) I, Governor Rooksebecca Rayshad Riviello, MD the attending physician have reviewed the labs noted below.  Labs Reviewed  BASIC METABOLIC PANEL - Abnormal; Notable for the following:       Result Value   Glucose, Bld 106 (*)    All other components within normal limits  CBC - Abnormal; Notable for the following:    WBC 11.5 (*)    All other components within normal limits  TROPONIN I  TROPONIN I    ____________________________________________    EKG I, Governor Rooksebecca Jakira Mcfadden, MD, the attending physician have personally viewed and interpreted all ECGs.  85 bpm normal sinus rhythm.  Narrow QS renal axis.  Normal wave ____________________________________________  RADIOLOGY All Xrays were viewed by me.  Imaging interpreted by Radiologist,  and I, Governor Rooksebecca Danell Verno, MD the attending physician have reviewed the radiologist interpretation noted below.  Chest x-ray two-view:  IMPRESSION: No active cardiopulmonary disease. __________________________________________  PROCEDURES  Procedure(s) performed: None  Critical Care performed: None  ____________________________________________  No current facility-administered medications on file prior to encounter.    Current Outpatient Prescriptions on File Prior to Encounter  Medication Sig Dispense Refill  . azithromycin (ZITHROMAX Z-PAK) 250 MG tablet Take 2 tablets (500 mg) on  Day 1,  followed by 1 tablet (250 mg) once daily on Days 2 through 5. 6 each 0  . guaiFENesin-codeine 100-10 MG/5ML syrup Take 10 mLs by mouth every 4 (four) hours as needed for cough. 180 mL 0  . naproxen (NAPROSYN) 500 MG tablet Take 1 tablet (500 mg total) by mouth 2 (two) times daily with  a meal. 60 tablet 0  . promethazine (PHENERGAN) 12.5 MG tablet Take 1 tablet (12.5 mg total) by mouth every 6 (six) hours as needed for nausea or vomiting. 10 tablet 0    ____________________________________________  ED COURSE / ASSESSMENT AND PLAN  Pertinent labs & imaging results that were available during my care of the patient were reviewed by me and considered in my medical decision making (see chart for details).   Patient is currently asymptomatic other than he is having some sweats.  Patient states that he has had a number of nonspecific and diffuse waxing and waning neurologic and possibly symptoms for which he is most concerned about sarcoid.  In any case, the episode today was stronger in terms of heart pounding which led him to call EMS, but he feels better now.  He has a primary care doctor which is following him in a stepwise fashion it sounds like it is referring to rheumatology next.   Repeat troponin negative.  DIFFERENTIAL DIAGNOSIS: Differential diagnosis includes, but is not limited to, ACS,  aortic dissection, pulmonary embolism, cardiac tamponade, pneumothorax, pneumonia, pericarditis/myocarditis, GI-related causes including esophagitis/gastritis, and musculoskeletal chest wall pain.    CONSULTATIONS:  None  Patient / Family / Caregiver informed of clinical course, medical decision-making process, and agree with plan.   I discussed return precautions, follow-up instructions, and discharge instructions with patient and/or family.  Discharge Instructions : You are evaluated for episode of burning to the hands and legs followed by heart pounding, and although no certain cause was found, your exam evaluation are overall reassuring in the emergency department.  Return to the emergency room immediately for any new or worsening or uncontrolled chest pain, trouble breathing, shortness breath, fever, skin rash, dizziness or passing out, one sided weakness or numbness, or any other symptoms concerning to you.  ___________________________________________   FINAL CLINICAL IMPRESSION(S) / ED DIAGNOSES   Final diagnoses:  Atypical chest pain              Note: This dictation was prepared with Dragon dictation. Any transcriptional errors that result from this process are unintentional    Governor Rooks, MD 07/17/17 1744

## 2017-07-17 NOTE — Discharge Instructions (Signed)
You are evaluated for episode of burning to the hands and legs followed by heart pounding, and although no certain cause was found, your exam evaluation are overall reassuring in the emergency department.  Return to the emergency room immediately for any new or worsening or uncontrolled chest pain, trouble breathing, shortness breath, fever, skin rash, dizziness or passing out, one sided weakness or numbness, or any other symptoms concerning to you.

## 2017-07-17 NOTE — ED Notes (Signed)
Patient declined Tylenol/ibuprofen for headache.

## 2017-07-17 NOTE — ED Triage Notes (Signed)
Pt is being evaluated for possible sarcoidosis, pt states that he had 7 teeth removed last week and was on z-pack which he finished on Sunday. Pt states that when he is on antibiotics he feels better, pt states that he is now started to feel bad all over, chest pain and feels like his heart is racing, ems reported heart rate of 116-120, heart rate of 95 in ER. Pt states that his body swells in the left side of his neck and shoulder, pt reports loss of weight of 20+ pounds.

## 2017-07-17 NOTE — ED Notes (Signed)
Patient was taken to imaging. 

## 2020-01-06 ENCOUNTER — Emergency Department: Payer: Medicare Other

## 2020-01-06 ENCOUNTER — Encounter: Payer: Self-pay | Admitting: Emergency Medicine

## 2020-01-06 ENCOUNTER — Other Ambulatory Visit: Payer: Self-pay

## 2020-01-06 ENCOUNTER — Emergency Department
Admission: EM | Admit: 2020-01-06 | Discharge: 2020-01-06 | Disposition: A | Discharge status: Home / Self Care | Payer: Medicare Other | Attending: Student | Admitting: Student

## 2020-01-06 DIAGNOSIS — Z87891 Personal history of nicotine dependence: Secondary | ICD-10-CM | POA: Insufficient documentation

## 2020-01-06 DIAGNOSIS — R1030 Lower abdominal pain, unspecified: Secondary | ICD-10-CM | POA: Insufficient documentation

## 2020-01-06 DIAGNOSIS — R109 Unspecified abdominal pain: Secondary | ICD-10-CM | POA: Insufficient documentation

## 2020-01-06 DIAGNOSIS — R140 Abdominal distension (gaseous): Secondary | ICD-10-CM | POA: Diagnosis not present

## 2020-01-06 DIAGNOSIS — R319 Hematuria, unspecified: Secondary | ICD-10-CM | POA: Diagnosis not present

## 2020-01-06 HISTORY — DX: Calculus of kidney: N20.0

## 2020-01-06 LAB — BASIC METABOLIC PANEL
Anion gap: 8 (ref 5–15)
BUN: 16 mg/dL (ref 6–20)
CO2: 28 mmol/L (ref 22–32)
Calcium: 10 mg/dL (ref 8.9–10.3)
Chloride: 104 mmol/L (ref 98–111)
Creatinine, Ser: 1.04 mg/dL (ref 0.61–1.24)
GFR calc Af Amer: >60 mL/min (ref >60)
GFR calc non Af Amer: >60 mL/min (ref >60)
Glucose, Bld: 91 mg/dL (ref 70–99)
Potassium: 3.9 mmol/L (ref 3.5–5.1)
Sodium: 140 mmol/L (ref 135–145)

## 2020-01-06 LAB — CBC WITH DIFFERENTIAL/PLATELET
Abs Immature Granulocytes: 0.02 10*3/uL (ref 0.00–0.07)
Basophils Absolute: 0.1 10*3/uL (ref 0.0–0.1)
Basophils Relative: 1 %
Eosinophils Absolute: 0.2 10*3/uL (ref 0.0–0.5)
Eosinophils Relative: 1 %
HCT: 44.8 % (ref 39.0–52.0)
Hemoglobin: 15.6 g/dL (ref 13.0–17.0)
Immature Granulocytes: 0 %
Lymphocytes Relative: 33 %
Lymphs Abs: 3.7 10*3/uL (ref 0.7–4.0)
MCH: 30.2 pg (ref 26.0–34.0)
MCHC: 34.8 g/dL (ref 30.0–36.0)
MCV: 86.7 fL (ref 80.0–100.0)
Monocytes Absolute: 0.8 10*3/uL (ref 0.1–1.0)
Monocytes Relative: 7 %
Neutro Abs: 6.3 10*3/uL (ref 1.7–7.7)
Neutrophils Relative %: 58 %
Platelets: 240 10*3/uL (ref 150–400)
RBC: 5.17 MIL/uL (ref 4.22–5.81)
RDW: 12.5 % (ref 11.5–15.5)
WBC: 11.1 10*3/uL — ABNORMAL HIGH (ref 4.0–10.5)
nRBC: 0 % (ref 0.0–0.2)

## 2020-01-06 LAB — URINALYSIS, COMPLETE (UACMP) WITH MICROSCOPIC
Bacteria, UA: NONE SEEN
Bilirubin Urine: NEGATIVE
Glucose, UA: NEGATIVE mg/dL
Hgb urine dipstick: NEGATIVE
Ketones, ur: NEGATIVE mg/dL
Leukocytes,Ua: NEGATIVE
Nitrite: NEGATIVE
Protein, ur: NEGATIVE mg/dL
Specific Gravity, Urine: 1.004 — ABNORMAL LOW (ref 1.005–1.030)
Squamous Epithelial / LPF: NONE SEEN (ref 0–5)
pH: 6 (ref 5.0–8.0)

## 2020-01-06 IMAGING — CT CT ABD-PELV W/ CM
2 of 5 series · 16 of 46 positions shown, 18 images · IV contrast (omnipaque)
Comparison: [DATE]

CLINICAL DATA: Bilateral lower abdominal pain, hematuria, nausea

EXAM:
CT ABDOMEN AND PELVIS WITH CONTRAST
TECHNIQUE: Multidetector CT imaging of the abdomen and pelvis was performed
using the standard protocol following bolus administration of
intravenous contrast.
CONTRAST:  100mL OMNIPAQUE IOHEXOL 300 MG/ML  SOLN

[Series 2: axial st · axial · 0.78mm/px · z∈[-1115,-675]mm · 13 of 100 slices shown, 15 images]
[im 6/100  soft-tissue]
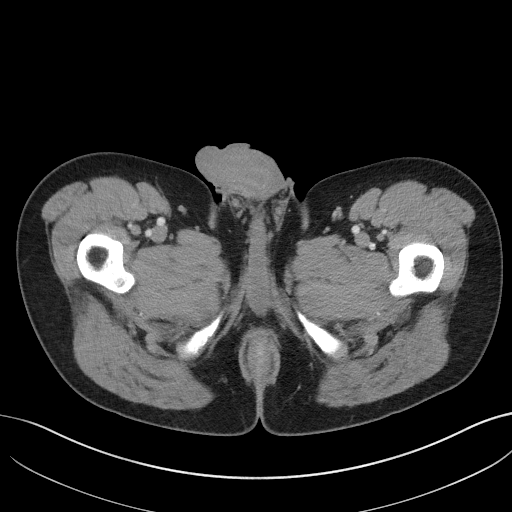
[im 6/100  bone]
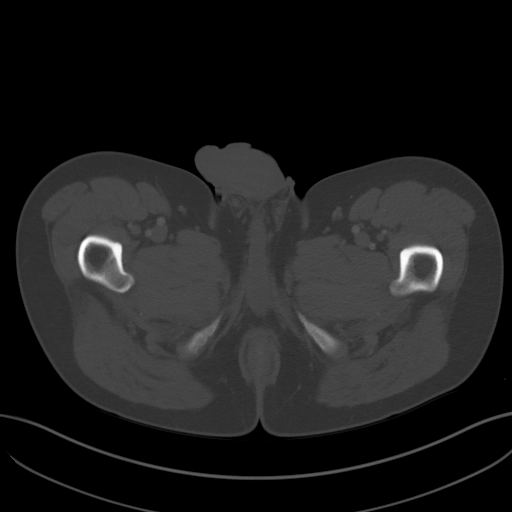
[im 16/100  soft-tissue]
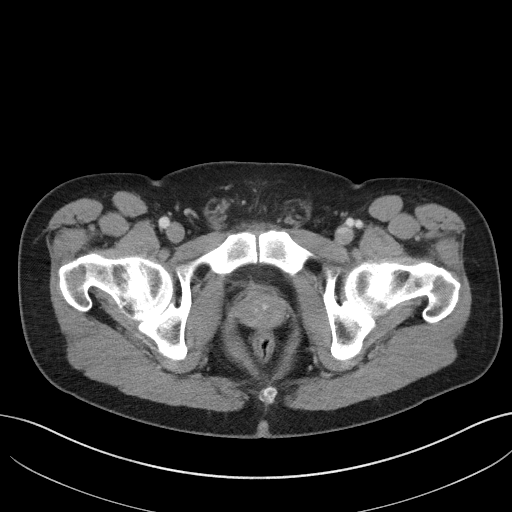
[im 21/100  soft-tissue]
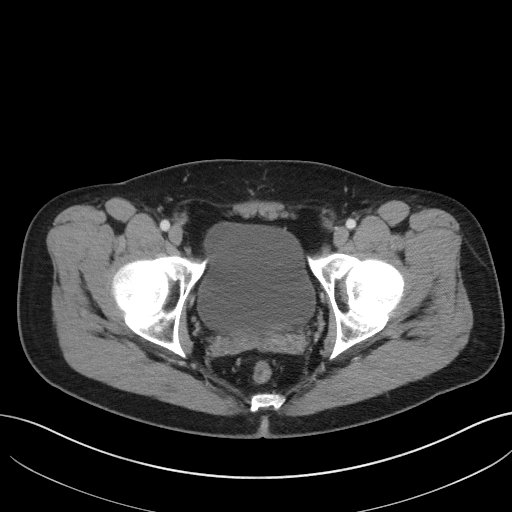
[im 27/100  soft-tissue]
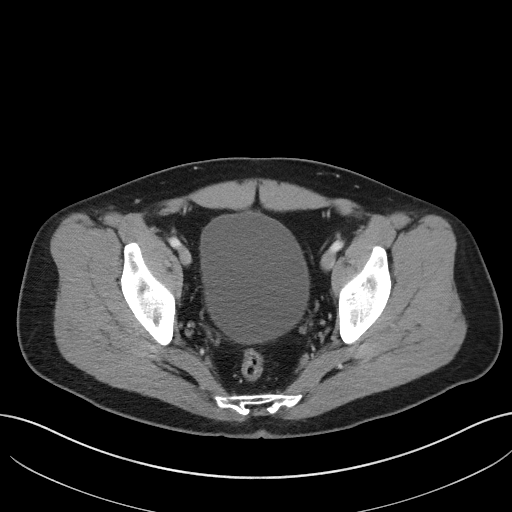
[im 37/100  soft-tissue]
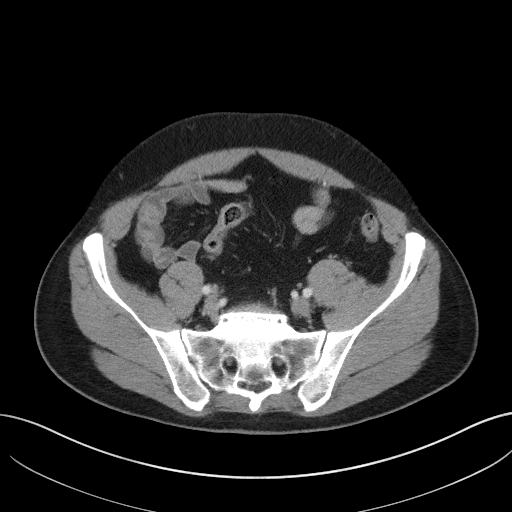
[im 42/100  soft-tissue]
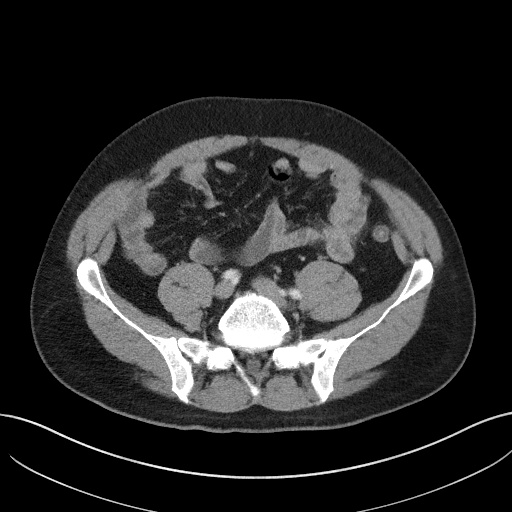
[im 53/100  soft-tissue]
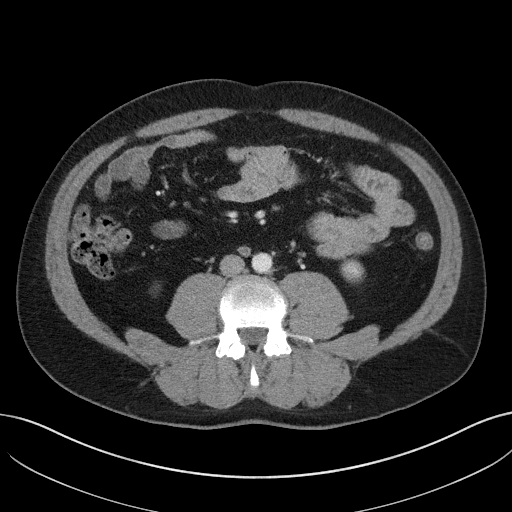
[im 58/100  soft-tissue]
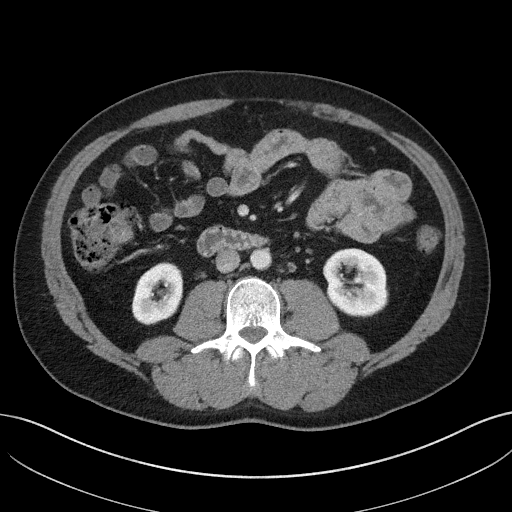
[im 63/100  soft-tissue]
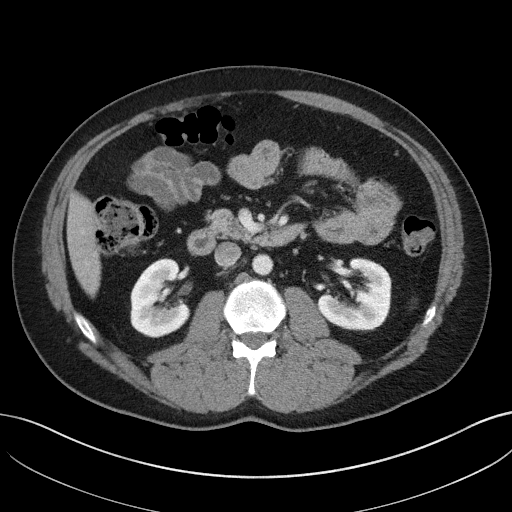
[im 63/100  bone]
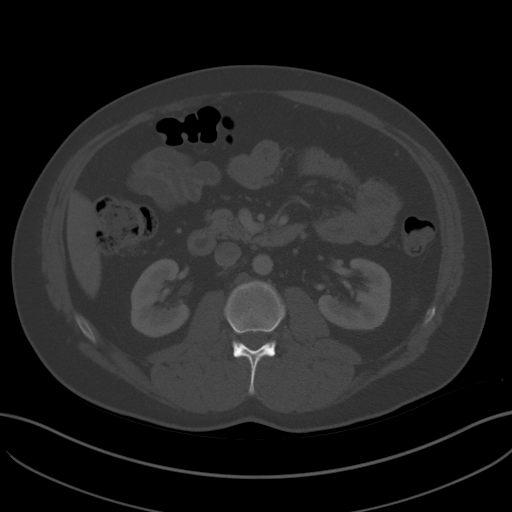
[im 73/100  soft-tissue]
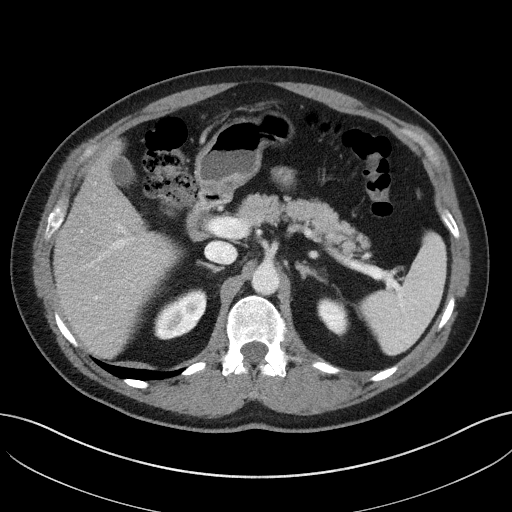
[im 79/100  soft-tissue]
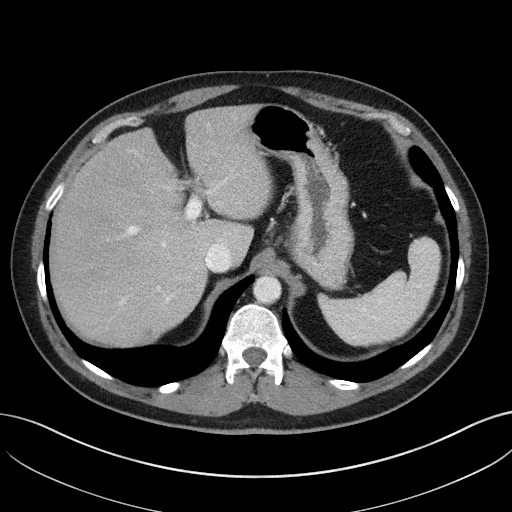
[im 84/100  soft-tissue]
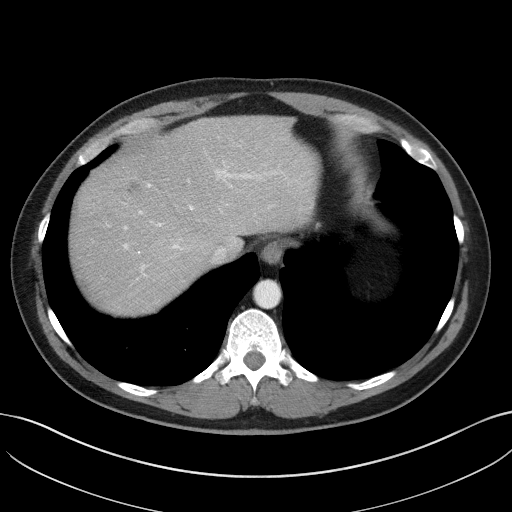
[im 94/100  soft-tissue]
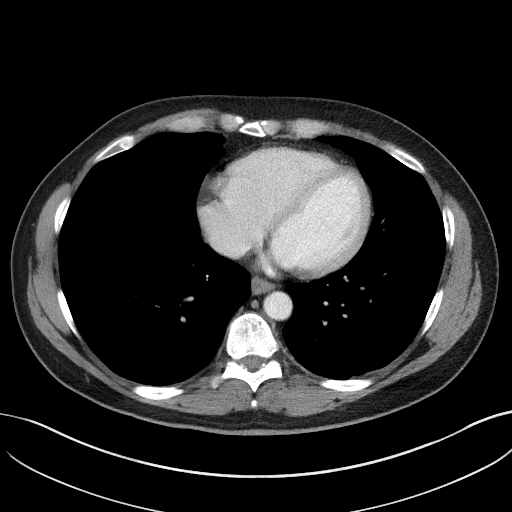

[Series 5: coronal st · coronal · 0.77mm/px · 3 of 90 slices shown]
[im 30/90  soft-tissue]
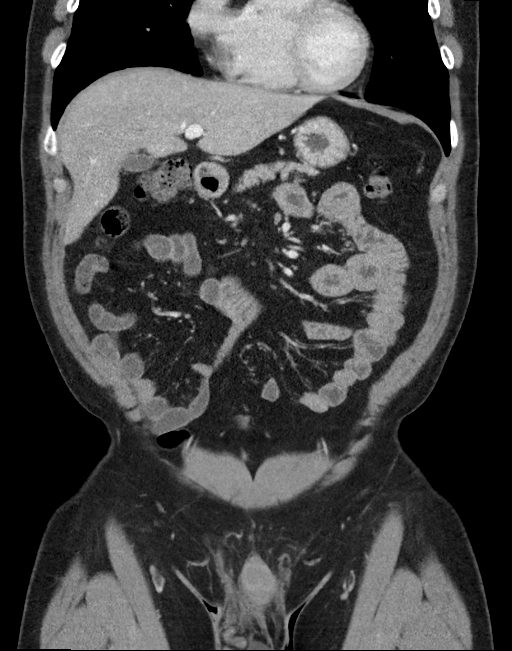
[im 40/90  soft-tissue]
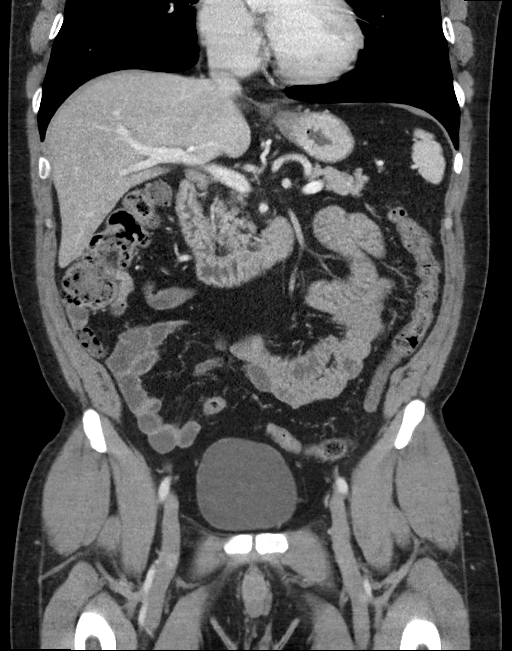
[im 50/90  soft-tissue]
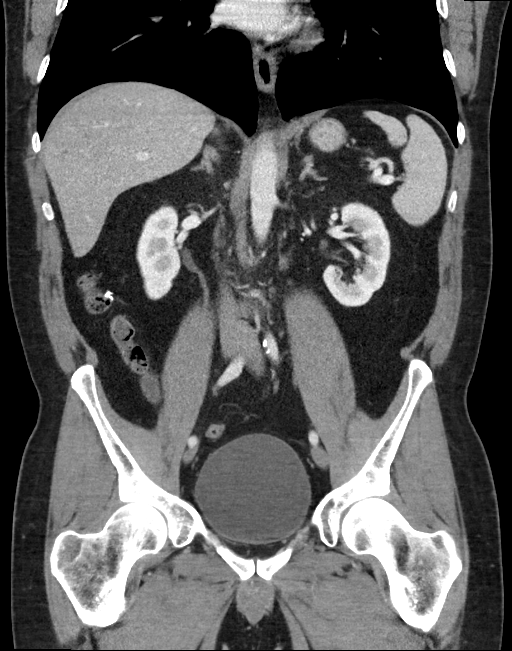

[16 of 46 positions shown; findings below may reference images not displayed]

FINDINGS: Lower chest: No acute pleural or parenchymal lung disease.

Hepatobiliary: Stable subcentimeter hypodensities within the liver
consistent with small cysts or hemangiomas. No other focal liver
abnormality. Gallbladder is unremarkable.

Pancreas: Unremarkable. No pancreatic ductal dilatation or
surrounding inflammatory changes.

Spleen: Normal in size without focal abnormality.

Adrenals/Urinary Tract: The kidneys enhance normally and
symmetrically. No urinary tract calculi or obstructive uropathy
within either kidney. Bladder is normal. The adrenals are
unremarkable.

Stomach/Bowel: No bowel obstruction or ileus. No bowel wall
thickening or inflammatory change. The appendix is surgically
absent.

Vascular/Lymphatic: Aortic atherosclerosis. No enlarged abdominal or
pelvic lymph nodes.

Reproductive: Prostate is unremarkable.

Other: No abdominal wall hernia or abnormality. No abdominopelvic
ascites.

Musculoskeletal: No acute or destructive bony lesions. Reconstructed
images demonstrate no additional findings.
IMPRESSION: 1. No urinary tract calculi or obstructive uropathy.
2. Stable subcentimeter hypodensities within the liver consistent
with small cysts or hemangiomas.
3. Aortic Atherosclerosis ([8T]-[8T]).

## 2020-01-06 MED ORDER — IOHEXOL 300 MG/ML  SOLN
100.0000 mL | Freq: Once | INTRAMUSCULAR | Status: AC | PRN
  Administered 2020-01-06: 100 mL via INTRAVENOUS

## 2020-01-06 MED ORDER — SODIUM CHLORIDE 0.9 % IV BOLUS
1000.0000 mL | Freq: Once | INTRAVENOUS | Status: AC
  Administered 2020-01-06: 1000 mL via INTRAVENOUS

## 2020-01-06 MED ORDER — ONDANSETRON HCL 4 MG/2ML IJ SOLN
4.0000 mg | Freq: Once | INTRAMUSCULAR | Status: AC
  Administered 2020-01-06: 4 mg via INTRAVENOUS
  Filled 2020-01-06: qty 2

## 2020-01-06 NOTE — ED Notes (Signed)
Pt given PO trial of ginger ale and graham crackers.

## 2020-01-06 NOTE — ED Triage Notes (Signed)
Pt here for pain at waist line that feels similar to when he has had kidney stones. Pain X 2 days with hematuria today. No vomiting or fevers, + nausea.  Did have left flank pain 2 days ago initially but now all in abdomen.  Ambulatory. NAD at this time.

## 2020-01-06 NOTE — Discharge Instructions (Addendum)
Thank you for letting us take care of you in the emergency department today.  ° °Please continue to take any regular, prescribed medications.  ° °Please follow up with: °- Your primary care doctor to review your ER visit and follow up on your symptoms.  ° °Please return to the ER for any new or worsening symptoms.  ° °

## 2020-01-06 NOTE — ED Provider Notes (Signed)
[facility] Emergency Department Provider Note  ____________________________________________   First MD Initiated Contact with Patient 01/06/20 1741     (approximate)  I have reviewed the triage vital signs and the nursing notes.  History  Chief Complaint Abdominal Pain and Flank Pain    HPI Jay Mcneil is a 49 y.o. male with a history of kidney stones, Crohn's disease (not on any daily medications) who presents to the emergency department for left-sided abdominal and flank pain, increased bowel movements, and hematuria.  Patient states symptoms have been ongoing for about a week.  Initially started with some low back pain and left flank pain.  He initially attributed this to doing extra yard work.  Now his pain has migrated and radiated to the lower abdomen.  Described as a burning type sensation.  In the last 24 hours he has noticed hematuria.  Reports a history of kidney stones, this feels somewhat similar, but not exactly the same.  He also notes an increase in his daily bowel movements.  Normally has several bowel movements in the morning and then is fine throughout the rest of the day.  Over the last week he has had several more bowel movements in the afternoon and evening.  Also feels like his belly is somewhat bloated.  Pain is currently 5/10 in severity.  No alleviating or aggravating components.  No fevers.  No sick contacts.  Has not been vaccinated against COVID.   Past Medical Hx Past Medical History:  Diagnosis Date  . Bipolar disorder ([place])   . Crohn's disease ([place])   . Kidney stone     Problem List There are no problems to display for this patient.   Past Surgical Hx Past Surgical History:  Procedure Laterality Date  . perianal surgery      Medications Prior to Admission medications   Medication Sig Start Date End Date Taking? Authorizing Provider  azithromycin (ZITHROMAX Z-PAK) 250 MG tablet Take 2 tablets (500 mg) on  Day 1,   followed by 1 tablet (250 mg) once daily on Days 2 through 5. 10/09/16   Beers, Pierce Crane, PA-C  guaiFENesin-codeine 100-10 MG/5ML syrup Take 10 mLs by mouth every 4 (four) hours as needed for cough. 10/09/16   Beers, Pierce Crane, PA-C  naproxen (NAPROSYN) 500 MG tablet Take 1 tablet (500 mg total) by mouth 2 (two) times daily with a meal. 10/09/16   Beers, Pierce Crane, PA-C  promethazine (PHENERGAN) 12.5 MG tablet Take 1 tablet (12.5 mg total) by mouth every 6 (six) hours as needed for nausea or vomiting. 08/31/16   Schuyler Amor, MD    Allergies Erythromycin, Penicillins, and Sulfa antibiotics  Family Hx History reviewed. No pertinent family history.  Social Hx Social History   Tobacco Use  . Smoking status: Former Research scientist (life sciences)  . Smokeless tobacco: Never Used  Substance Use Topics  . Alcohol use: Yes    Comment: occ  . Drug use: Not on file     Review of Systems  Constitutional: Negative for fever. Negative for chills. Eyes: Negative for visual changes. ENT: Negative for sore throat. Cardiovascular: Negative for chest pain. Respiratory: Negative for shortness of breath. Gastrointestinal: Positive for abdominal pain, increased bowel movements. Genitourinary: Positive for hematuria. Musculoskeletal: Negative for leg swelling. Skin: Negative for rash. Neurological: Negative for headaches.   Physical Exam  Vital Signs: ED Triage Vitals  Enc Vitals Group     BP 01/06/20 1631 (!) 145/94     Pulse  Rate 01/06/20 1631 82     Resp 01/06/20 1631 16     Temp 01/06/20 1631 99.1 F (37.3 C)     Temp Source 01/06/20 1631 Oral     SpO2 01/06/20 1631 97 %     Weight 01/06/20 1625 185 lb (83.9 kg)     Height 01/06/20 1625 6' (1.829 m)     Head Circumference --      Peak Flow --      Pain Score 01/06/20 1624 5     Pain Loc --      Pain Edu? --      Excl. in GC? --     Constitutional: Alert and oriented. Well appearing. NAD.  Head: Normocephalic. Atraumatic. Eyes: Conjunctivae  clear. Sclera anicteric. Pupils equal and symmetric. Nose: No masses or lesions. No congestion or rhinorrhea. Mouth/Throat: Wearing mask.  Neck: No stridor. Trachea midline.  Cardiovascular: Normal rate, regular rhythm. Extremities well perfused. Respiratory: Normal respiratory effort.  Lungs CTAB. Gastrointestinal: Soft.  Slightly bloated, but not frankly distended.  Nontender. Genitourinary: Deferred. Musculoskeletal: No lower extremity edema. No deformities. Neurologic:  Normal speech and language. No gross focal or lateralizing neurologic deficits are appreciated.  Skin: Skin is warm, dry and intact. No rash noted. Psychiatric: Mood and affect are appropriate for situation.    Radiology  Personally reviewed available imaging myself.   CT A/P - IMPRESSION:  1. No urinary tract calculi or obstructive uropathy.  2. Stable subcentimeter hypodensities within the liver consistent  with small cysts or hemangiomas.  3. Aortic Atherosclerosis (ICD10-I70.0).    Procedures  Procedure(s) performed (including critical care):  Procedures   Initial Impression / Assessment and Plan / MDM / ED Course  49 y.o. male who presents to the ED for left flank pain, lower abdominal pain, hematuria, increased bowel movements  Ddx: nephrolithiasis, colitis, Crohn's flare, colitis  Will plan for labs, urine studies, imaging, fluids and antiemetics  Clinical Course as of Jan 07 107  Fri Jan 06, 2020  2018 Labs and imaging studies are unremarkable. No acute abnormalities. Perhaps patient had a recently passed stone given improvement in his symptoms and clearing of his urine. Updated patient on results. He has been able to tolerate PO without difficulty. As such, feel patient is stable for discharge with outpatient follow-up. He is comfortable with this plan. Given return precautions.   [SM]    Clinical Course User Index [SM] Miguel Aschoff., MD     _______________________________   As  part of my medical decision making I have reviewed available labs, radiology tests, reviewed old records/performed chart review, obtained additional history from family.  Final Clinical Impression(s) / ED Diagnosis  Final diagnoses:  Left flank pain  Lower abdominal pain  Hematuria, unspecified type       Note:  This document was prepared using Dragon voice recognition software and may include unintentional dictation errors.   Miguel Aschoff., MD 01/07/20 [id number]

## 2020-01-06 NOTE — ED Triage Notes (Signed)
FIRST NURSE NOTE- here for right and left abdominal pain. Hx of stones, feels similar. Started with hematuria today.

## 2021-12-19 ENCOUNTER — Other Ambulatory Visit: Payer: Self-pay

## 2021-12-19 ENCOUNTER — Emergency Department
Admission: EM | Admit: 2021-12-19 | Discharge: 2021-12-19 | Disposition: A | Discharge status: Home / Self Care | Payer: Medicare Other | Attending: Emergency Medicine | Admitting: Emergency Medicine

## 2021-12-19 ENCOUNTER — Emergency Department: Payer: Medicare Other

## 2021-12-19 DIAGNOSIS — M545 Low back pain, unspecified: Secondary | ICD-10-CM | POA: Insufficient documentation

## 2021-12-19 LAB — URINALYSIS, ROUTINE W REFLEX MICROSCOPIC
Bacteria, UA: NONE SEEN
Bilirubin Urine: NEGATIVE
Glucose, UA: NEGATIVE mg/dL
Ketones, ur: NEGATIVE mg/dL
Leukocytes,Ua: NEGATIVE
Nitrite: NEGATIVE
Protein, ur: NEGATIVE mg/dL
Specific Gravity, Urine: 1.011 (ref 1.005–1.030)
Squamous Epithelial / HPF: NONE SEEN (ref 0–5)
WBC, UA: NONE SEEN WBC/hpf (ref 0–5)
pH: 6 (ref 5.0–8.0)

## 2021-12-19 LAB — CBC WITH DIFFERENTIAL/PLATELET
Abs Immature Granulocytes: 0.02 10*3/uL (ref 0.00–0.07)
Basophils Absolute: 0 10*3/uL (ref 0.0–0.1)
Basophils Relative: 0 %
Eosinophils Absolute: 0.1 10*3/uL (ref 0.0–0.5)
Eosinophils Relative: 1 %
HCT: 46 % (ref 39.0–52.0)
Hemoglobin: 15.9 g/dL (ref 13.0–17.0)
Immature Granulocytes: 0 %
Lymphocytes Relative: 24 %
Lymphs Abs: 2.7 10*3/uL (ref 0.7–4.0)
MCH: 29.7 pg (ref 26.0–34.0)
MCHC: 34.6 g/dL (ref 30.0–36.0)
MCV: 86 fL (ref 80.0–100.0)
Monocytes Absolute: 0.9 10*3/uL (ref 0.1–1.0)
Monocytes Relative: 8 %
Neutro Abs: 7.7 10*3/uL (ref 1.7–7.7)
Neutrophils Relative %: 67 %
Platelets: 266 10*3/uL (ref 150–400)
RBC: 5.35 MIL/uL (ref 4.22–5.81)
RDW: 12 % (ref 11.5–15.5)
WBC: 11.3 10*3/uL — ABNORMAL HIGH (ref 4.0–10.5)
nRBC: 0 % (ref 0.0–0.2)

## 2021-12-19 LAB — BASIC METABOLIC PANEL
Anion gap: 13 (ref 5–15)
BUN: 25 mg/dL — ABNORMAL HIGH (ref 6–20)
CO2: 20 mmol/L — ABNORMAL LOW (ref 22–32)
Calcium: 9.6 mg/dL (ref 8.9–10.3)
Chloride: 104 mmol/L (ref 98–111)
Creatinine, Ser: 0.98 mg/dL (ref 0.61–1.24)
GFR, Estimated: >60 mL/min (ref >60)
Glucose, Bld: 103 mg/dL — ABNORMAL HIGH (ref 70–99)
Potassium: 3.8 mmol/L (ref 3.5–5.1)
Sodium: 137 mmol/L (ref 135–145)

## 2021-12-19 IMAGING — MR MR LUMBAR SPINE W/O CM
5 series · 32 of 48 positions shown · non-contrast
Comparison: None.

CLINICAL DATA: Low back pain, prior surgery, new symptoms

EXAM:
MRI LUMBAR SPINE WITHOUT CONTRAST
TECHNIQUE: Multiplanar, multisequence MR imaging of the lumbar spine was
performed. No intravenous contrast was administered.

[Series 5: T2 · sagittal · 4.0mm · 0.81mm/px · 7 of 17 slices shown (1 of 2)]
[im 1/17]
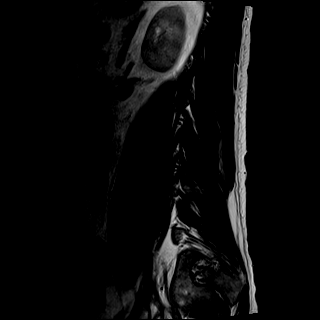
[im 3/17]
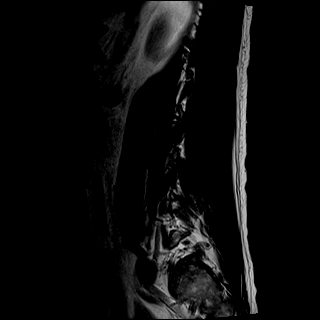
[im 6/17]
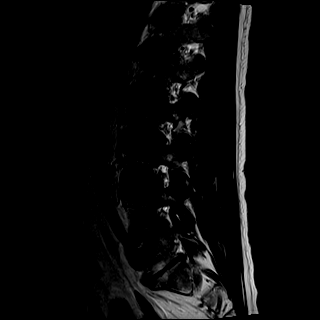
[im 9/17]
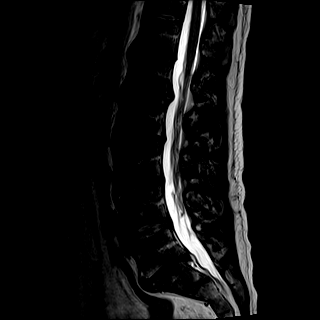
[im 11/17]
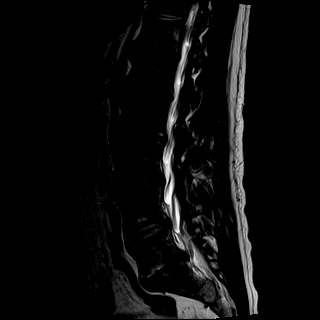
[im 14/17]
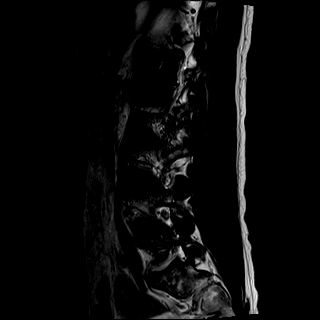
[im 17/17]
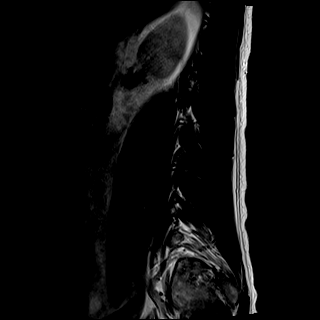

[Series 6: T1 · sagittal · 4.0mm · 0.81mm/px · 7 of 17 slices shown (1 of 2)]
[im 1/17]
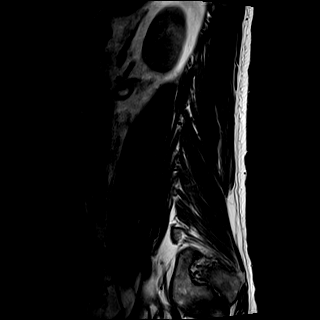
[im 3/17]
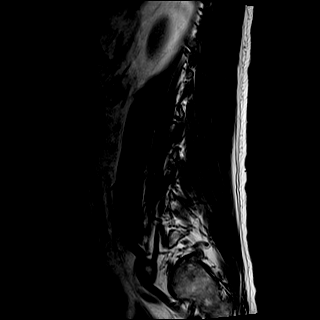
[im 6/17]
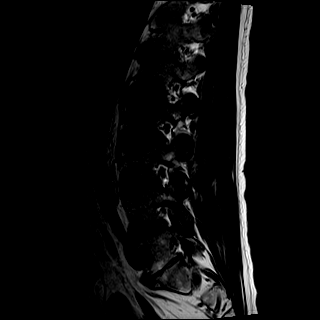
[im 9/17]
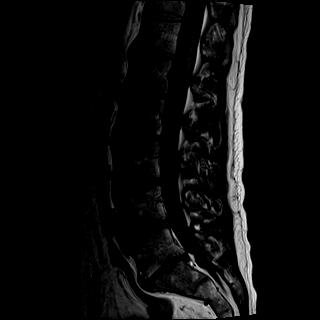
[im 11/17]
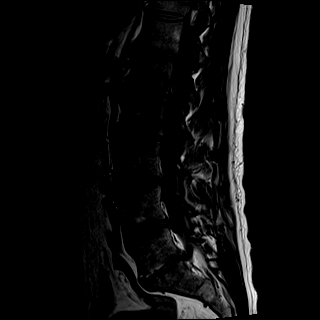
[im 14/17]
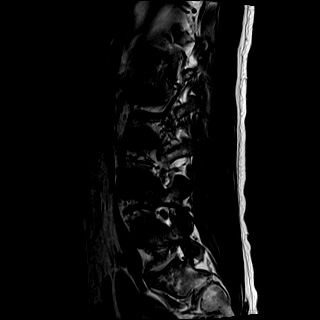
[im 17/17]
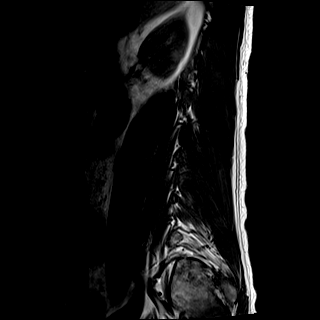

[Series 7: STIR · sagittal · 4.0mm · 0.41mm/px · 2 of 17 slices shown]
[im 1/17]
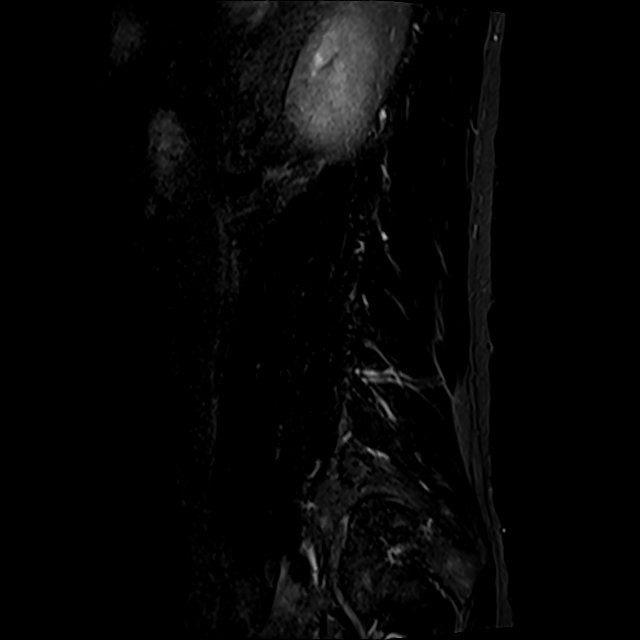
[im 4/17]
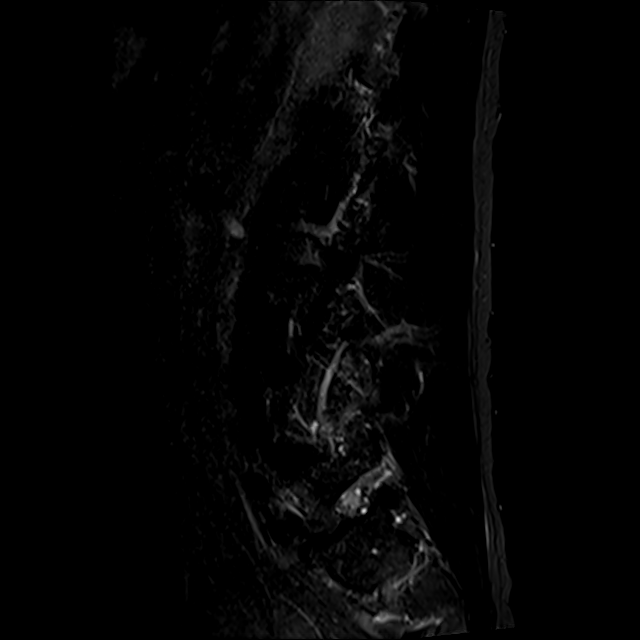

[Series 8: T2 · axial · 4.0mm · 0.78mm/px · z∈[-148,+71]mm · 8 of 38 slices shown (2 of 2)]
[im 1/38]
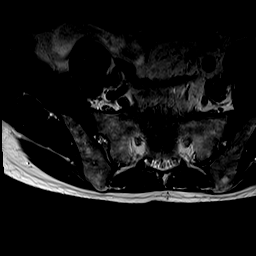
[im 6/38]
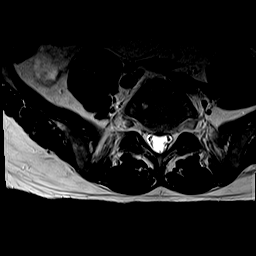
[im 12/38]
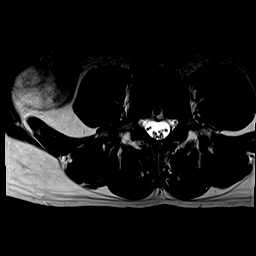
[im 18/38]
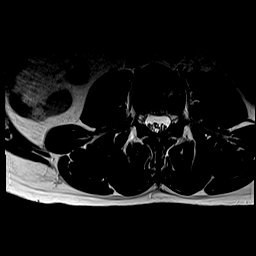
[im 20/38]
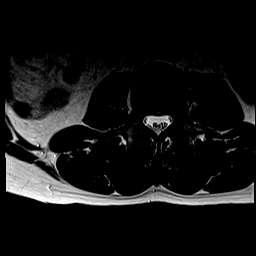
[im 26/38]
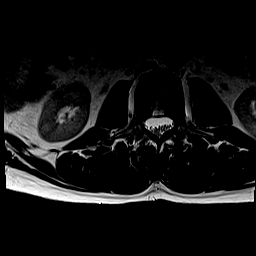
[im 32/38]
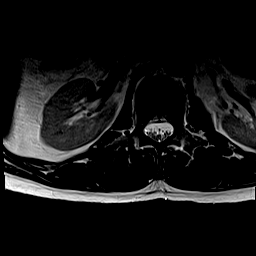
[im 38/38]
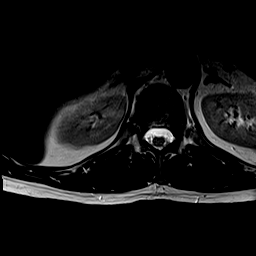

[Series 9: T1 · axial · 4.0mm · 0.39mm/px · z∈[-148,+71]mm · 8 of 38 slices shown (2 of 2)]
[im 1/38]
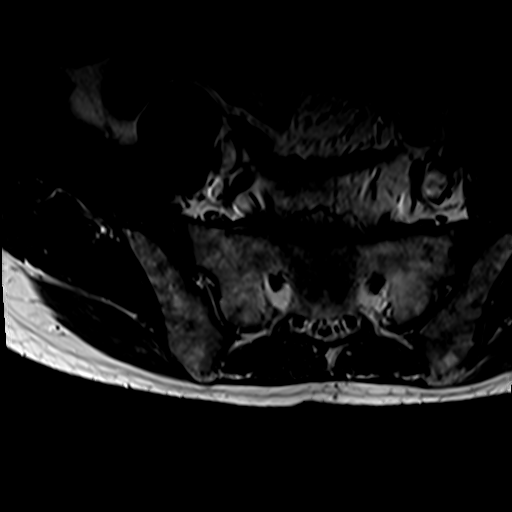
[im 6/38]
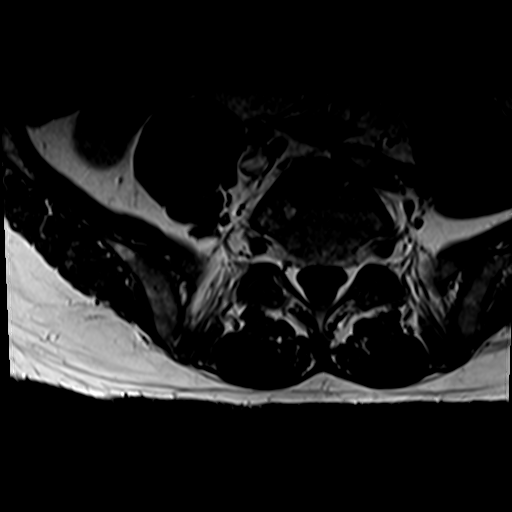
[im 12/38]
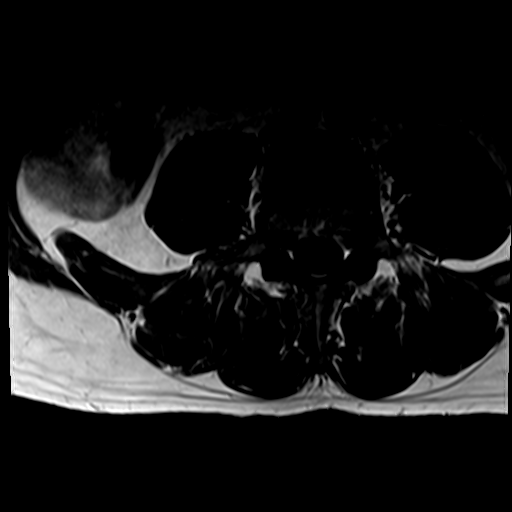
[im 18/38]
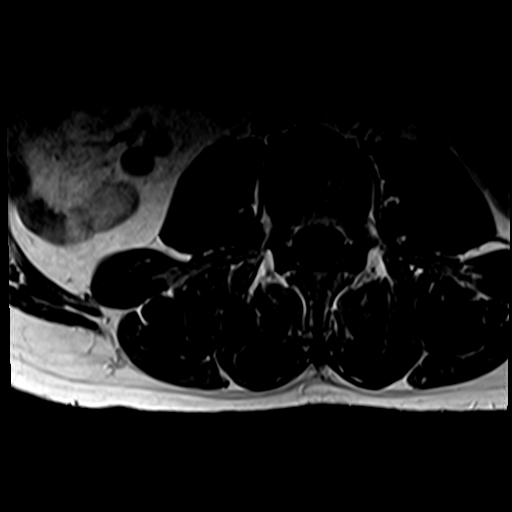
[im 20/38]
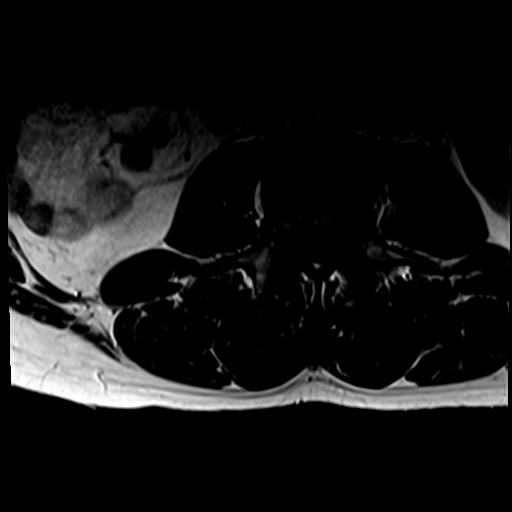
[im 26/38]
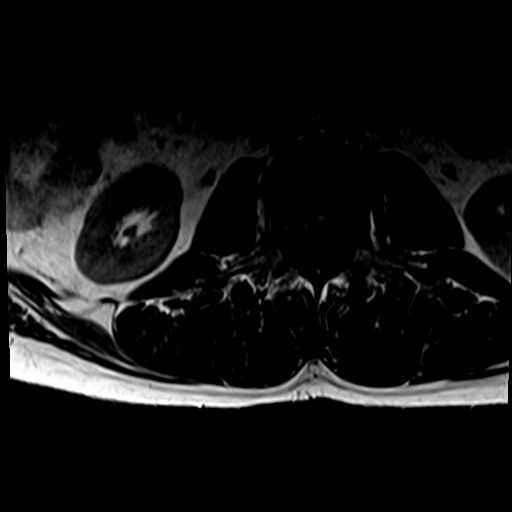
[im 32/38]
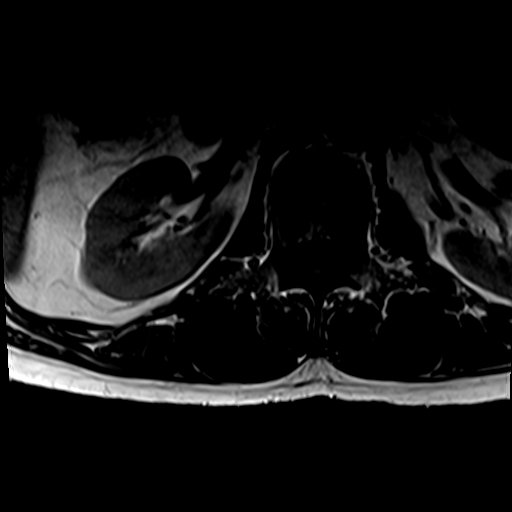
[im 38/38]
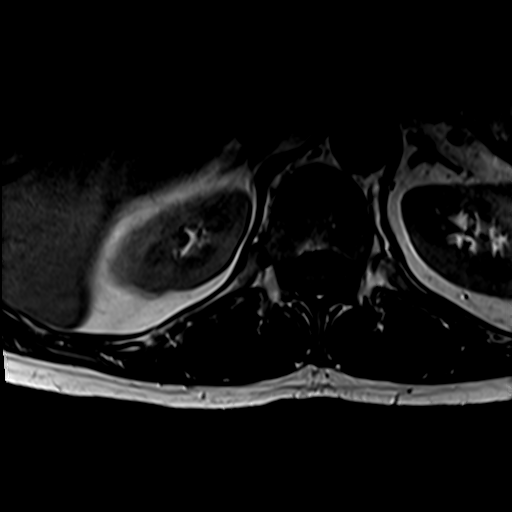

[32 of 48 positions shown; findings below may reference images not displayed]

FINDINGS: Segmentation:  Standard.

Alignment:  Preserved.

Vertebrae: Vertebral body heights are maintained. No substantial
marrow edema. No suspicious osseous lesion.

Conus medullaris and cauda equina: Conus extends to the L1 level.
Conus and cauda equina appear normal.

Paraspinal and other soft tissues: Unremarkable.

Disc levels:

L1-L2: Central disc protrusion slightly eccentric to the right with
annular fissure. Minor canal stenosis. No foraminal stenosis.

L2-L3:  Disc bulge.  No significant canal or foraminal stenosis.

L3-L4: Minimal disc bulge. No significant canal or foraminal
stenosis.

L4-L5:  No canal or foraminal stenosis.

L5-S1: Disc bulge with endplate osteophytic ridging. No canal
stenosis. Minor foraminal stenosis.
IMPRESSION: Mild degenerative changes as detailed above without significant
stenosis.

## 2021-12-19 MED ORDER — MORPHINE SULFATE (PF) 4 MG/ML IV SOLN
4.0000 mg | Freq: Once | INTRAVENOUS | Status: AC
  Administered 2021-12-19: 4 mg via INTRAVENOUS
  Filled 2021-12-19: qty 1

## 2021-12-19 MED ORDER — PREDNISONE 10 MG (21) PO TBPK: ORAL_TABLET | ORAL | 0 refills | Status: DC

## 2021-12-19 MED ORDER — METHOCARBAMOL 500 MG PO TABS: 500.0000 mg | ORAL_TABLET | Freq: Four times a day (QID) | ORAL | 0 refills | Status: AC

## 2021-12-19 MED ORDER — OXYCODONE-ACETAMINOPHEN 5-325 MG PO TABS: 1.0000 | ORAL_TABLET | Freq: Four times a day (QID) | ORAL | 0 refills | Status: AC | PRN

## 2021-12-19 MED ORDER — ONDANSETRON HCL 4 MG/2ML IJ SOLN
4.0000 mg | Freq: Once | INTRAMUSCULAR | Status: AC
  Administered 2021-12-19: 4 mg via INTRAVENOUS
  Filled 2021-12-19: qty 2

## 2021-12-19 NOTE — ED Notes (Signed)
See triage note  presents pain to lower back which radiates into abd  also having pain to mid back  states pain started yesterday  became worse today  ?

## 2021-12-19 NOTE — ED Provider Notes (Signed)
[facility] ?Emergency Department Provider Note ?____________________________________________ ? ?Time seen: Approximately 2:18 PM ? ?I have reviewed the triage vital signs and the nursing notes. ? ?HISTORY ? ?Chief Complaint ?Back Pain ? ? ?HPI ?Jay Mcneil is a 51 y.o. male presents to the emergency department for treatment and evaluation of low back pain.  No known injury.  Chronic back pain but this is different.  He noticed a change yesterday and the pain got worse overnight.  He also states that his urine stream has been very weak since the pain got worse. ? ?Past Medical History:  ?Diagnosis Date  ? Bipolar disorder ([place])   ? Crohn's disease ([place])   ? Kidney stone   ? ? ?There are no problems to display for this patient. ? ? ?Past Surgical History:  ?Procedure Laterality Date  ? perianal surgery    ? ? ?Prior to Admission medications   ?Medication Sig Start Date End Date Taking? Authorizing Provider  ?methocarbamol (ROBAXIN) 500 MG tablet Take 1 tablet (500 mg total) by mouth 4 (four) times daily. 12/19/21  Yes Yaretzy Olazabal B, FNP  ?oxyCODONE-acetaminophen (PERCOCET) 5-325 MG tablet Take 1 tablet by mouth every 6 (six) hours as needed. 12/19/21 12/19/22 Yes Olive Zmuda, Dessa Phi, FNP  ?predniSONE (STERAPRED UNI-PAK 21 TAB) 10 MG (21) TBPK tablet Take 6 tablets on the first day and decrease by 1 tablet each day until finished. 12/19/21  Yes Tomoya Ringwald, Dessa Phi, FNP  ? ? ?Allergies ?Erythromycin, Penicillins, and Sulfa antibiotics ? ?History reviewed. No pertinent family history. ? ?Social History ?Social History  ? ?Tobacco Use  ? Smoking status: Former  ? Smokeless tobacco: Never  ?Substance Use Topics  ? Alcohol use: Yes  ?  Comment: occ  ? ? ?Review of Systems ?Musculoskeletal:  ? Negative for chronic steroid use  ? Negative for trauma in the presence of osteoporosis ? Negative for age over 35 and trauma. ? Negative for constitutional symptoms, or history of cancer ? Positive for pain worse at  night. ?Skin: Negative for rash, lesion, or wound.  ?Genitourinary: Negative for urinary retention. Positive for urinary changes. ?Rectal: Negative for fecal incontinence or new onset constipation/bowel habit changes. ?Hematological/Immunilogical: Negative for immunosuppression, IV drug use, or fever ?Neurological: Negative for burning, tingling, numb, electric, radiating pain in the lower extremities.. ?                       Negative for saddle anesthesia. ?                       Negative for focal neurologic deficit, progressive or disabling symptoms ?          ____________________________________________ ? ? ?PHYSICAL EXAM: ? ?VITAL SIGNS: ?ED Triage Vitals  ?Enc Vitals Group  ?   BP 12/19/21 1101 (!) 139/92  ?   Pulse Rate 12/19/21 1101 97  ?   Resp 12/19/21 1101 20  ?   Temp 12/19/21 1101 98.8 ?F (37.1 ?C)  ?   Temp Source 12/19/21 1101 Oral  ?   SpO2 12/19/21 1101 99 %  ?   Weight 12/19/21 1057 154 lb (69.9 kg)  ?   Height 12/19/21 1118 6' (1.829 m)  ?   Head Circumference --   ?   Peak Flow --   ?   Pain Score 12/19/21 1057 7  ?   Pain Loc --   ?   Pain Edu? --   ?  Excl. in [place]? --   ? ? ?Constitutional: Alert and oriented. Uncomfortable appearing and in no acute distress. ?Eyes: Conjunctivae are clear without discharge or drainage.  ?Head: Atraumatic. ?Neck: Full, active range of motion. ?Respiratory: Respirations even and unlabored. ?Musculoskeletal: Limited ROM of the back and extremities, Strength 5/5 of the lower extremities as tested. ?Neurologic: Reflexes of the lower extremities are 2+. Negative straight leg raise on the right and left side. ?Skin: Atraumatic.  ?Psychiatric: Behavior and affect are normal. ? ?____________________________________________ ?  ?LABS ?(all labs ordered are listed, but only abnormal results are displayed) ? ?Labs Reviewed  ?BASIC METABOLIC PANEL - Abnormal; Notable for the following components:  ?    Result Value  ? CO2 20 (*)   ? Glucose, Bld 103 (*)   ? BUN 25 (*)   ?  All other components within normal limits  ?CBC WITH DIFFERENTIAL/PLATELET - Abnormal; Notable for the following components:  ? WBC 11.3 (*)   ? All other components within normal limits  ?URINALYSIS, ROUTINE W REFLEX MICROSCOPIC - Abnormal; Notable for the following components:  ? Color, Urine STRAW (*)   ? APPearance CLEAR (*)   ? Hgb urine dipstick SMALL (*)   ? All other components within normal limits  ? ?____________________________________________ ? ?RADIOLOGY ? ?MR lumbar spine shows mild degenerative changes without significant stenosis. ? ?Radiology report and images reviewed by me. ?____________________________________________ ? ? ?PROCEDURES ? ?Procedure(s) performed:  ?Procedures ?____________________________________________ ? ? ?INITIAL IMPRESSION / ASSESSMENT AND PLAN / ED COURSE ? ?Jay Mcneil is a 51 y.o. male presents to the ER for symptoms as described in the HPI. Patient appears very uncomfortable. Pain medications ordered. Due to urinary symptoms and severity of pain, will get MR lumbar. Patient and family aware and agreeable to the plan. ? ?Patient reassessed after MR. Pain has increased again due to having to lay on the MR table. Medication ordered. ? ?MR report reviewed with patient and family. Pain may be from areas of bulging discs, but there are no urgent findings of concern. He is to follow up with both his PCP as well as his GI doctor as he tells me that he is having intermittent Crohn's flares. He denies having noted blood in his stool today or any increase in amount of diarrhea. No abdominal pain or vomiting today.  ? ?He will be discharged home with Rx for prednisone, Robaxin, and percocet. He is to return to the ER for symptoms that change or worsen. ? ?Medications  ?morphine (PF) 4 MG/ML injection 4 mg (4 mg Intravenous Given 12/19/21 1231)  ?ondansetron (ZOFRAN) injection 4 mg (4 mg Intravenous Given 12/19/21 1230)  ?morphine (PF) 4 MG/ML injection 4 mg (4 mg Intravenous Given  12/19/21 1345)  ? ? ?ED Discharge Orders   ? ?      Ordered  ?  predniSONE (STERAPRED UNI-PAK 21 TAB) 10 MG (21) TBPK tablet       ? 12/19/21 1443  ?  oxyCODONE-acetaminophen (PERCOCET) 5-325 MG tablet  Every 6 hours PRN       ? 12/19/21 1443  ?  methocarbamol (ROBAXIN) 500 MG tablet  4 times daily       ? 12/19/21 1443  ? ?  ?  ? ?  ? ? ?  ? ?As part of my medical decision making, I reviewed the following data within the [place] reviewed, Old chart reviewed, Radiograph reviewed, Notes from prior ED visits, and [place] Controlled Substance Database ? ? ?  Pertinent labs & imaging results that were available during my care of the patient were reviewed by me and considered in my medical decision making (see chart for details). ?  ?_________________________________________ ? ? ?FINAL CLINICAL IMPRESSION(S) / ED DIAGNOSES ? ? ?Final diagnoses:  ?Acute midline low back pain without sciatica  ? ? ? ?If controlled substance prescribed during this visit, 12 month history viewed on the [place] prior to issuing an initial prescription for Schedule II or III opiod. ? ?  ?Victorino Dike, FNP ?12/19/21 1451 ? ?  ?Harvest Dark, MD ?12/19/21 1503 ? ?

## 2021-12-19 NOTE — Discharge Instructions (Signed)
Please call to schedule an appointment with your primary care provider as well as your GI doctor. ? ?Return to the emergency department if symptoms change or worsen. ?

## 2021-12-19 NOTE — ED Triage Notes (Signed)
Pt comes pov with lower, central back pain starting today. States that he doesn't think he has done anything to injure it. Hx of stones and back surgeries. Does not feel like a stone per pt.  ?

## 2021-12-22 ENCOUNTER — Emergency Department: Payer: Medicare Other

## 2021-12-22 ENCOUNTER — Emergency Department
Admission: EM | Admit: 2021-12-22 | Discharge: 2021-12-22 | Disposition: A | Discharge status: Home / Self Care | Payer: Medicare Other | Attending: Emergency Medicine | Admitting: Emergency Medicine

## 2021-12-22 DIAGNOSIS — R1030 Lower abdominal pain, unspecified: Secondary | ICD-10-CM | POA: Insufficient documentation

## 2021-12-22 DIAGNOSIS — R200 Anesthesia of skin: Secondary | ICD-10-CM | POA: Insufficient documentation

## 2021-12-22 DIAGNOSIS — M545 Low back pain, unspecified: Secondary | ICD-10-CM | POA: Insufficient documentation

## 2021-12-22 DIAGNOSIS — R2990 Unspecified symptoms and signs involving the nervous system: Secondary | ICD-10-CM | POA: Diagnosis not present

## 2021-12-22 DIAGNOSIS — R202 Paresthesia of skin: Secondary | ICD-10-CM | POA: Insufficient documentation

## 2021-12-22 DIAGNOSIS — R531 Weakness: Secondary | ICD-10-CM | POA: Insufficient documentation

## 2021-12-22 DIAGNOSIS — M546 Pain in thoracic spine: Secondary | ICD-10-CM | POA: Diagnosis not present

## 2021-12-22 LAB — CBC
HCT: 45.1 % (ref 39.0–52.0)
Hemoglobin: 15.9 g/dL (ref 13.0–17.0)
MCH: 30.2 pg (ref 26.0–34.0)
MCHC: 35.3 g/dL (ref 30.0–36.0)
MCV: 85.7 fL (ref 80.0–100.0)
Platelets: 253 10*3/uL (ref 150–400)
RBC: 5.26 MIL/uL (ref 4.22–5.81)
RDW: 11.9 % (ref 11.5–15.5)
WBC: 7.3 10*3/uL (ref 4.0–10.5)
nRBC: 0 % (ref 0.0–0.2)

## 2021-12-22 LAB — URINALYSIS, ROUTINE W REFLEX MICROSCOPIC
Bilirubin Urine: NEGATIVE
Glucose, UA: NEGATIVE mg/dL
Hgb urine dipstick: NEGATIVE
Ketones, ur: NEGATIVE mg/dL
Leukocytes,Ua: NEGATIVE
Nitrite: NEGATIVE
Protein, ur: NEGATIVE mg/dL
Specific Gravity, Urine: 1.005 (ref 1.005–1.030)
pH: 7 (ref 5.0–8.0)

## 2021-12-22 LAB — DIFFERENTIAL
Abs Immature Granulocytes: 0.02 10*3/uL (ref 0.00–0.07)
Basophils Absolute: 0.1 10*3/uL (ref 0.0–0.1)
Basophils Relative: 1 %
Eosinophils Absolute: 0.1 10*3/uL (ref 0.0–0.5)
Eosinophils Relative: 1 %
Immature Granulocytes: 0 %
Lymphocytes Relative: 32 %
Lymphs Abs: 2.3 10*3/uL (ref 0.7–4.0)
Monocytes Absolute: 0.5 10*3/uL (ref 0.1–1.0)
Monocytes Relative: 7 %
Neutro Abs: 4.3 10*3/uL (ref 1.7–7.7)
Neutrophils Relative %: 59 %

## 2021-12-22 LAB — COMPREHENSIVE METABOLIC PANEL
ALT: 36 U/L (ref 0–44)
AST: 30 U/L (ref 15–41)
Albumin: 4.4 g/dL (ref 3.5–5.0)
Alkaline Phosphatase: 48 U/L (ref 38–126)
Anion gap: 9 (ref 5–15)
BUN: 20 mg/dL (ref 6–20)
CO2: 22 mmol/L (ref 22–32)
Calcium: 9.7 mg/dL (ref 8.9–10.3)
Chloride: 108 mmol/L (ref 98–111)
Creatinine, Ser: 1.04 mg/dL (ref 0.61–1.24)
GFR, Estimated: >60 mL/min (ref >60)
Glucose, Bld: 120 mg/dL — ABNORMAL HIGH (ref 70–99)
Potassium: 3.8 mmol/L (ref 3.5–5.1)
Sodium: 139 mmol/L (ref 135–145)
Total Bilirubin: 0.6 mg/dL (ref 0.3–1.2)
Total Protein: 7.6 g/dL (ref 6.5–8.1)

## 2021-12-22 LAB — LACTIC ACID, PLASMA
Lactic Acid, Venous: 2.4 mmol/L (ref 0.5–1.9)
Lactic Acid, Venous: 0.8 mmol/L (ref 0.5–1.9)

## 2021-12-22 LAB — TROPONIN I (HIGH SENSITIVITY): Troponin I (High Sensitivity): <2 ng/L (ref <18)

## 2021-12-22 LAB — APTT: aPTT: 36 seconds (ref 24–36)

## 2021-12-22 LAB — CBG MONITORING, ED: Glucose-Capillary: 129 mg/dL — ABNORMAL HIGH (ref 70–99)

## 2021-12-22 LAB — PROTIME-INR
INR: 1 (ref 0.8–1.2)
Prothrombin Time: 13 seconds (ref 11.4–15.2)

## 2021-12-22 IMAGING — CT CT ANGIO CHEST-ABD-PELV FOR DISSECTION W/ AND WO/W CM
2 of 7 series · 13 of 46 positions shown, 15 images · IV contrast (APPLIED)
Comparison: Abdomen/pelvis CT [DATE]

CLINICAL DATA: Acute aortic syndrome. Severe back pain with
left-sided weakness. History of Crohn disease.

EXAM:
CT ANGIOGRAPHY CHEST, ABDOMEN AND PELVIS
TECHNIQUE: Non-contrast CT of the chest was initially obtained.

[Series 6: axial arterial · axial · arterial · 0.83mm/px · z∈[-844,-258]mm · 10 of 226 slices shown, 12 images]
[im 16/226  soft-tissue]
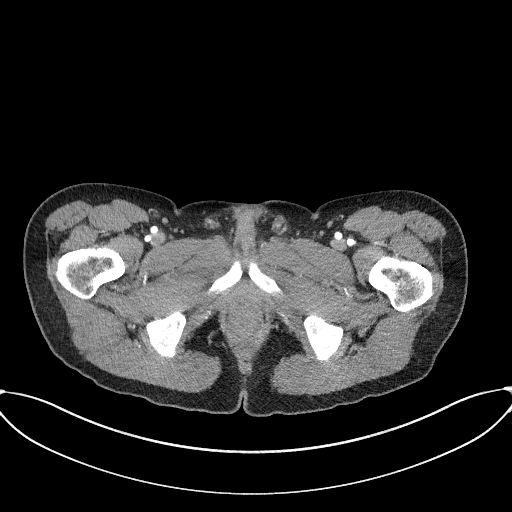
[im 16/226  bone]
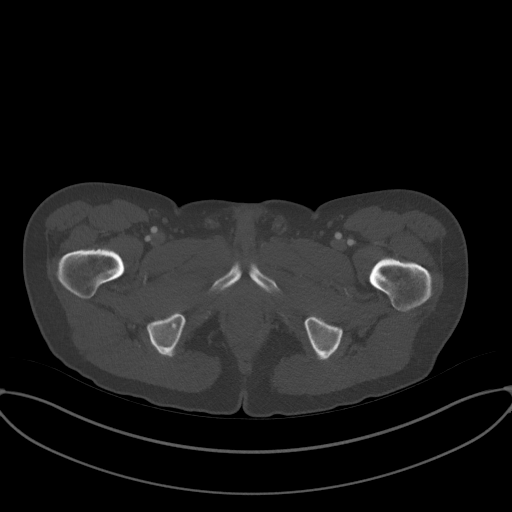
[im 46/226  soft-tissue]
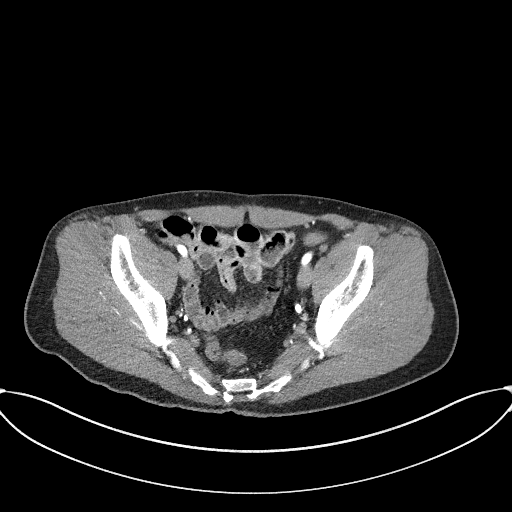
[im 61/226  soft-tissue]
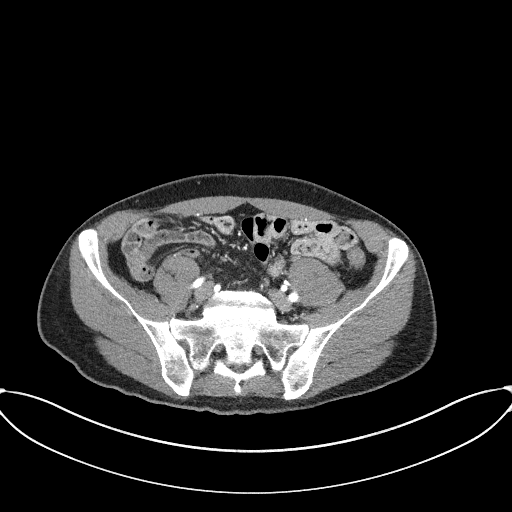
[im 76/226  soft-tissue]
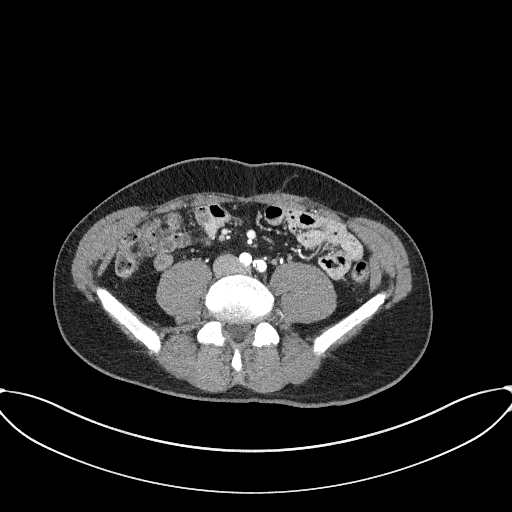
[im 106/226  soft-tissue]
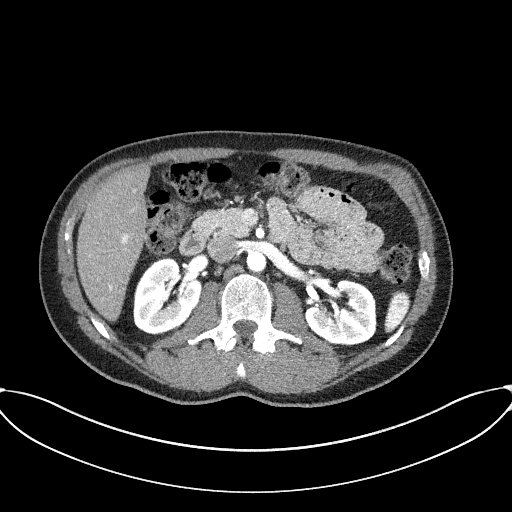
[im 121/226  soft-tissue]
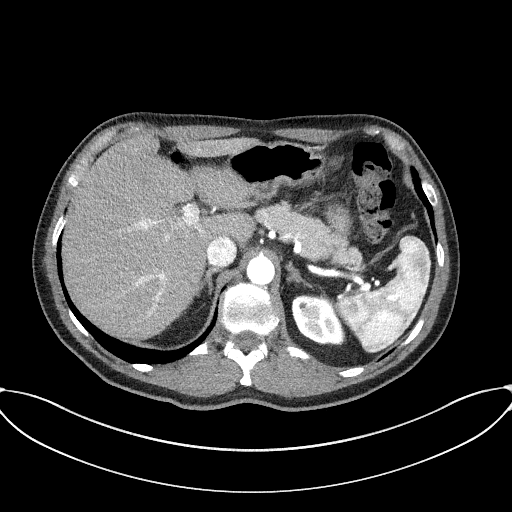
[im 151/226  soft-tissue]
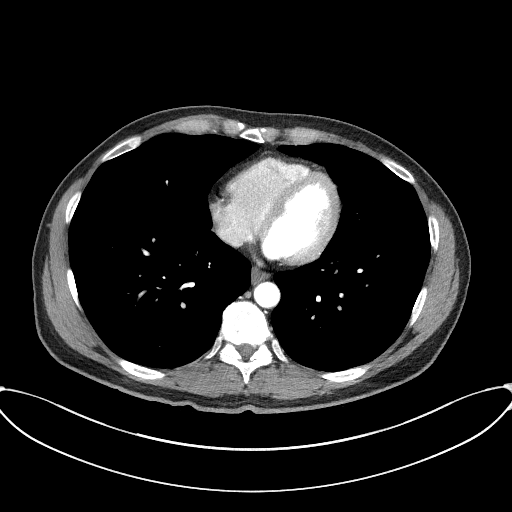
[im 166/226  soft-tissue]
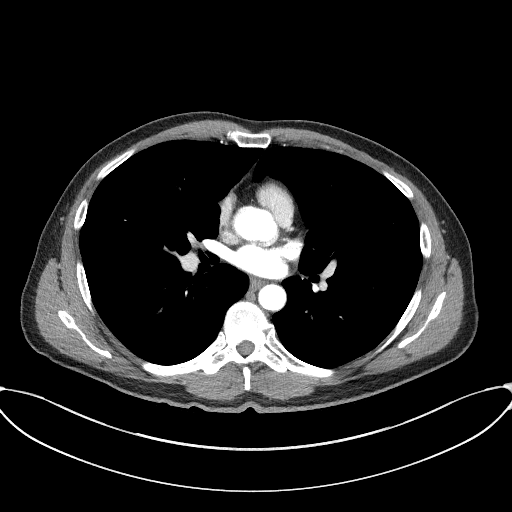
[im 181/226  soft-tissue]
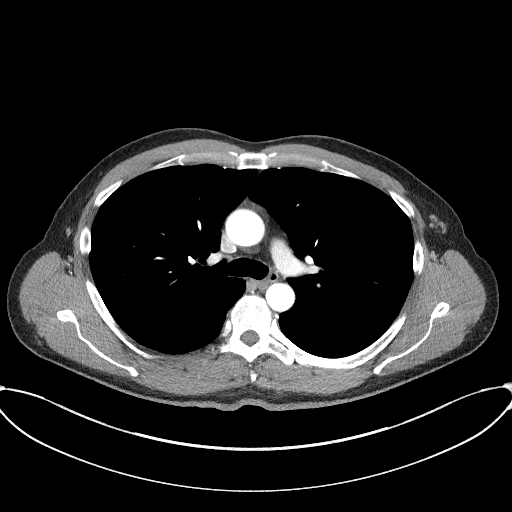
[im 181/226  bone]
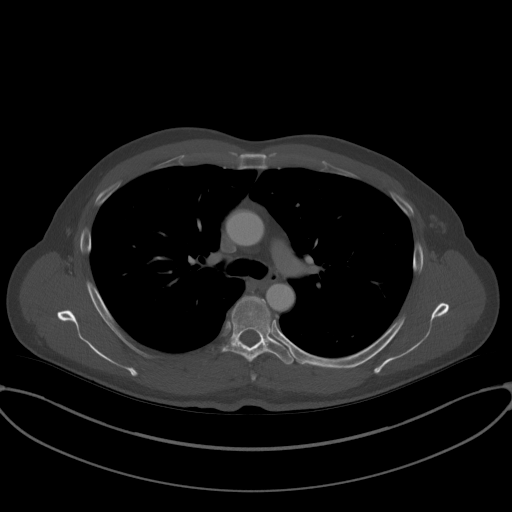
[im 211/226  soft-tissue]
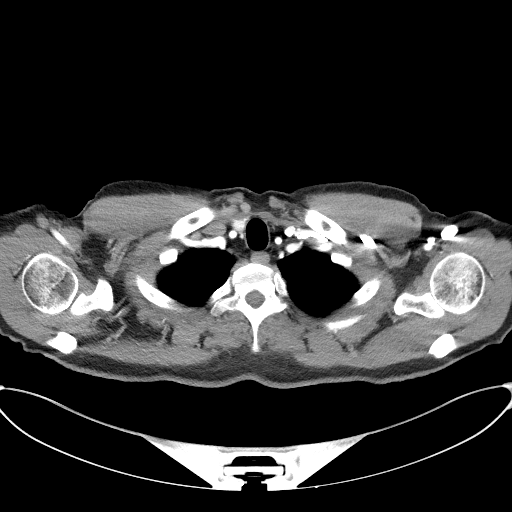

[Series 8: coronals · coronal · 0.91mm/px · 3 of 132 slices shown]
[im 33/132  soft-tissue]
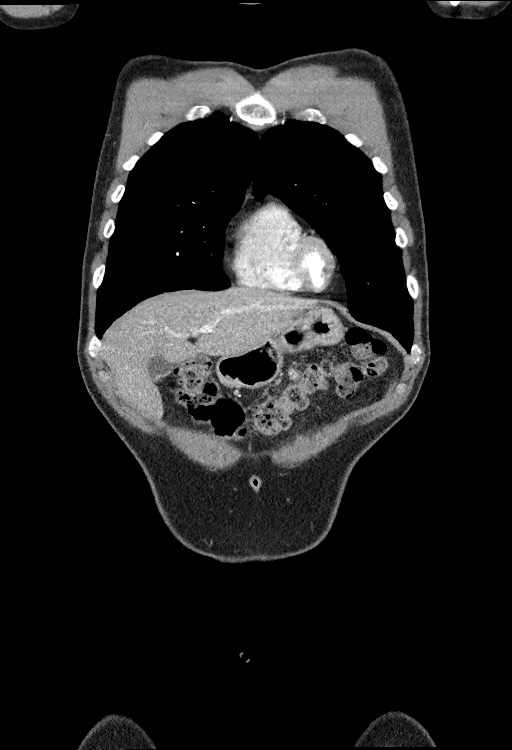
[im 66/132  soft-tissue]
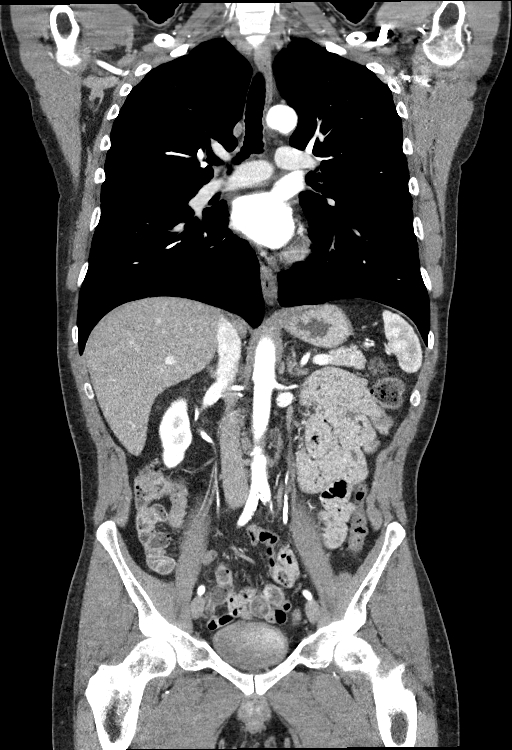
[im 99/132  soft-tissue]
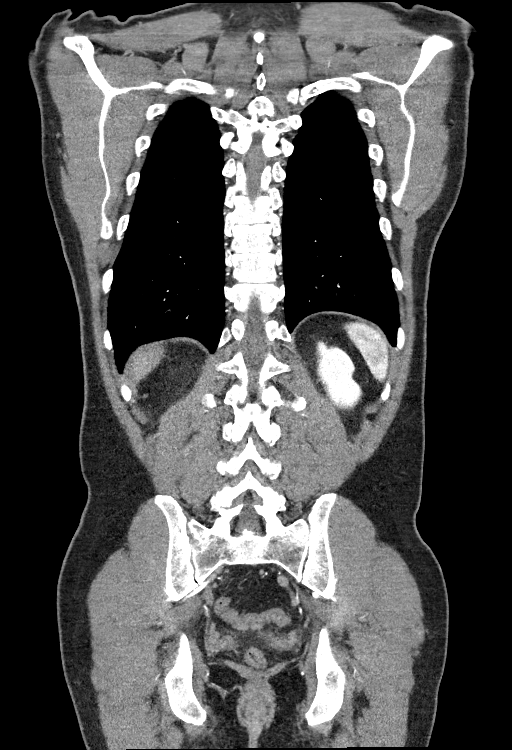

[13 of 46 positions shown; findings below may reference images not displayed]

Multidetector CT imaging through the chest, abdomen and pelvis was
performed using the standard protocol during bolus administration of
intravenous contrast. Multiplanar reconstructed images and MIPs were
obtained and reviewed to evaluate the vascular anatomy.

RADIATION DOSE REDUCTION: This exam was performed according to the
departmental dose-optimization program which includes automated
exposure control, adjustment of the mA and/or kV according to
patient size and/or use of iterative reconstruction technique.

CONTRAST:  150mL OMNIPAQUE IOHEXOL 350 MG/ML SOLN
FINDINGS: CTA CHEST FINDINGS

Cardiovascular: The heart size is normal. No substantial pericardial
effusion. Pre contrast imaging shows no hyperdense crescent in the
wall of the thoracic aorta to suggest acute intramural hematoma. No
thoracic aortic aneurysm. No dissection of the thoracic aorta. No
large central pulmonary embolus in the main pulmonary arteries or
lobar pulmonary arteries to either lung.

Mediastinum/Nodes: No mediastinal lymphadenopathy. There is no hilar
lymphadenopathy. The esophagus has normal imaging features. There is
no axillary lymphadenopathy.

Lungs/Pleura: Trace biapical pleuroparenchymal scarring evident. No
suspicious pulmonary nodule or mass. No focal airspace
consolidation. There is no evidence of pleural effusion.

Musculoskeletal: No worrisome lytic or sclerotic osseous
abnormality.

Review of the MIP images confirms the above findings.

CTA ABDOMEN AND PELVIS FINDINGS

VASCULAR

Aorta: Normal caliber aorta without aneurysm, dissection, vasculitis
or significant stenosis.

Celiac: Patent without evidence of aneurysm, dissection, vasculitis
or significant stenosis.

SMA: Patent without evidence of aneurysm, dissection, vasculitis or
significant stenosis.

Renals: Both renal arteries are patent without evidence of aneurysm,
dissection, vasculitis, fibromuscular dysplasia or significant
stenosis. Accessory renal arteries noted bilaterally.

IMA: Patent without evidence of aneurysm, dissection, vasculitis or
significant stenosis.

Inflow: Patent without evidence of aneurysm, dissection, vasculitis
or significant stenosis.

Veins: No obvious venous abnormality within the limitations of this
arterial phase study.

Review of the MIP images confirms the above findings.

NON-VASCULAR

Hepatobiliary: No suspicious focal abnormality within the liver
parenchyma. There is no evidence for gallstones, gallbladder wall
thickening, or pericholecystic fluid. No intrahepatic or
extrahepatic biliary dilation.

Pancreas: No focal mass lesion. No dilatation of the main duct. No
intraparenchymal cyst. No peripancreatic edema.

Spleen: No splenomegaly. No focal mass lesion.

Adrenals/Urinary Tract: No adrenal nodule or mass. Kidneys
unremarkable. No evidence for hydroureter. The urinary bladder
appears normal for the degree of distention.

Stomach/Bowel: Stomach is unremarkable. No gastric wall thickening.
No evidence of outlet obstruction. Duodenum is normally positioned
as is the ligament of Treitz. No small bowel wall thickening. No
small bowel dilatation. The terminal ileum is normal. The appendix
is not well visualized, but there is no edema or inflammation in the
region of the cecum. No gross colonic mass. No colonic wall
thickening.

Lymphatic: There is no gastrohepatic or hepatoduodenal ligament
lymphadenopathy. No retroperitoneal or mesenteric lymphadenopathy.
No pelvic sidewall lymphadenopathy.

Reproductive: The prostate gland and seminal vesicles are
unremarkable.

Other: No intraperitoneal free fluid.

Musculoskeletal: No worrisome lytic or sclerotic osseous
abnormality.

Review of the MIP images confirms the above findings.
IMPRESSION: 1. No evidence for thoracoabdominal aortic aneurysm or dissection.
No findings to suggest acute intramural hematoma within the thoracic
aorta.
2. No acute findings in the chest, abdomen, or pelvis.
3. Reported history kidney stones. Renal calices obscured on the
current study due to excretion of contrast material from CTA
head/neck performed immediately prior to this study.

## 2021-12-22 IMAGING — CT CT ANGIO HEAD-NECK (W OR W/O PERF)
2 of 7 series · 8 of 33 positions shown · IV contrast (APPLIED)
Comparison: Head CT same day

CLINICAL DATA: Neuro deficit, acute, stroke suspected.

EXAM:
CT ANGIOGRAPHY HEAD AND NECK
TECHNIQUE: Multidetector CT imaging of the head and neck was performed using
the standard protocol during bolus administration of intravenous
contrast. Multiplanar CT image reconstructions and MIPs were
obtained to evaluate the vascular anatomy. Carotid stenosis
measurements (when applicable) are obtained utilizing NASCET
criteria, using the distal internal carotid diameter as the
denominator.

[Series 4: cta head neck · axial · 0.55mm/px · z∈[-212,-80]mm · 2 of 198 slices shown]
[im 66/198  soft-tissue]
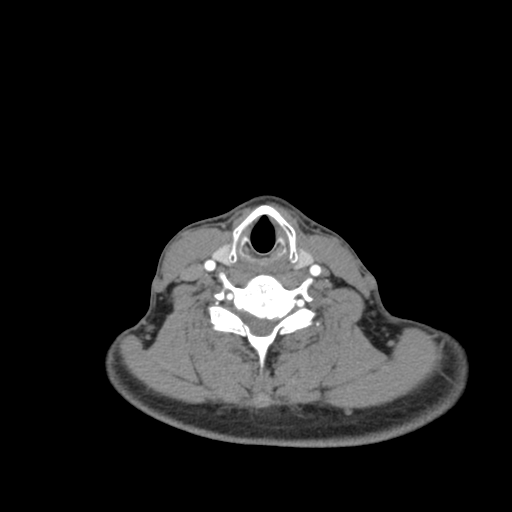
[im 132/198  soft-tissue]
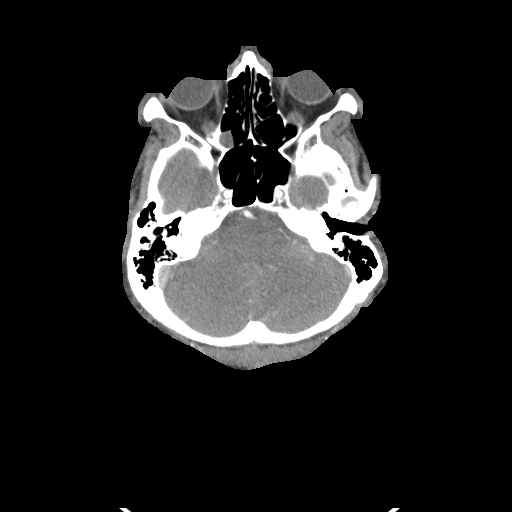

[Series 6: ax thin · axial · 0.56mm/px · z∈[-345,-41]mm · 6 of 437 slices shown]
[im 63/437  soft-tissue]
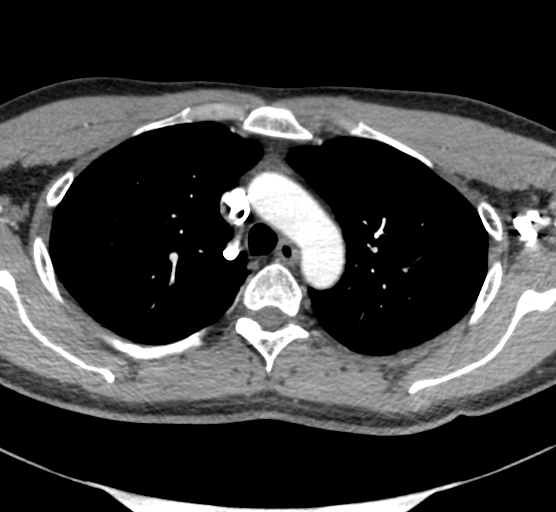
[im 125/437  bone]
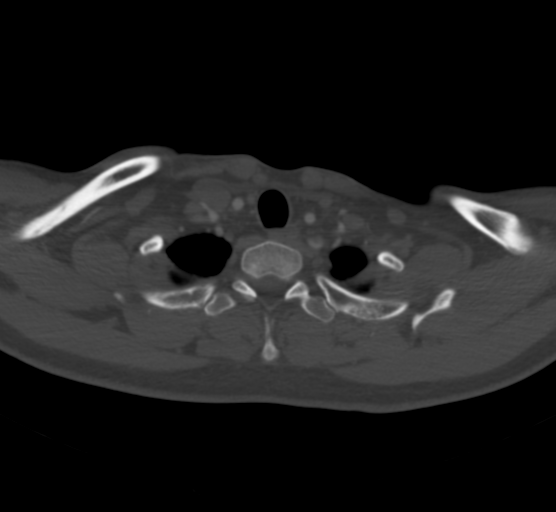
[im 187/437  soft-tissue]
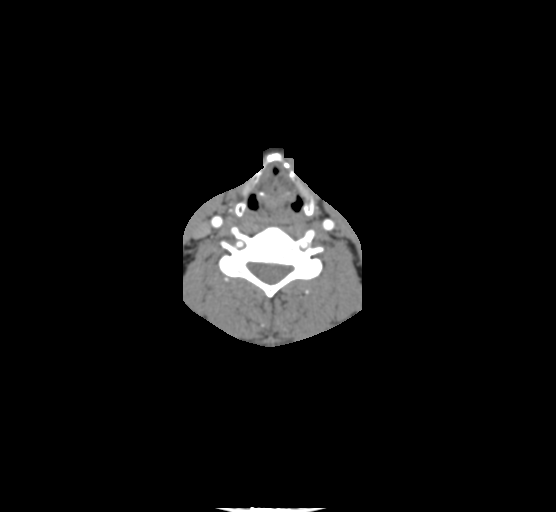
[im 250/437  bone]
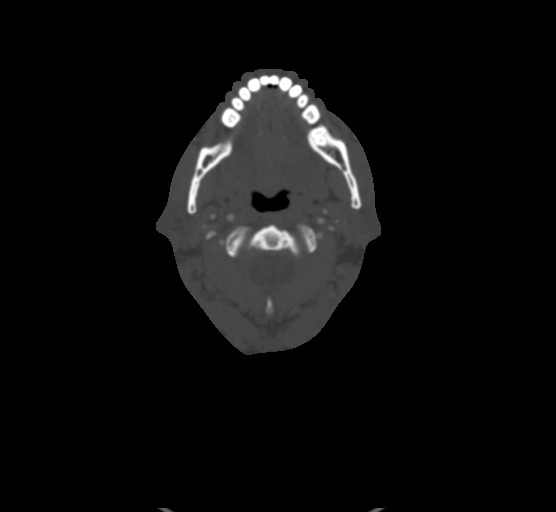
[im 312/437  soft-tissue]
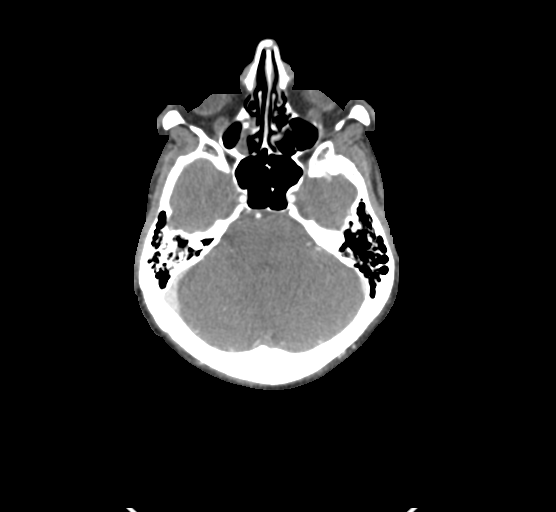
[im 374/437  bone]
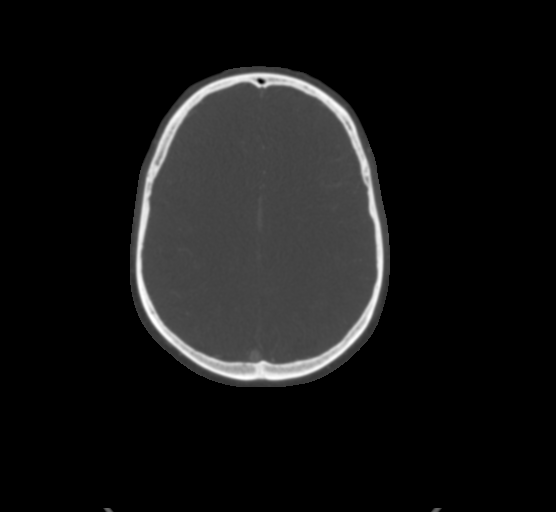

[8 of 33 positions shown; findings below may reference images not displayed]

RADIATION DOSE REDUCTION: This exam was performed according to the
departmental dose-optimization program which includes automated
exposure control, adjustment of the mA and/or kV according to
patient size and/or use of iterative reconstruction technique.

CONTRAST:  150mL OMNIPAQUE IOHEXOL 350 MG/ML SOLN
FINDINGS: CTA NECK FINDINGS

Aortic arch: Normal

Right carotid system: Common carotid artery widely patent to the
bifurcation. Carotid bifurcation is normal without soft or calcified
plaque. Cervical ICA is normal.

Left carotid system: Left carotid system similarly normal.

Vertebral arteries: Both vertebral artery origins widely patent.
Both vertebral arteries are normal through the cervical region to
the foramen magnum.

Skeleton: Normal

Other neck: No mass or lymphadenopathy.

Upper chest: Normal.

Review of the MIP images confirms the above findings

CTA HEAD FINDINGS

Anterior circulation: Both internal carotid arteries are widely
patent through the skull base and siphon regions. No siphon
calcification or stenosis. The anterior and middle cerebral vessels
are normal. No large vessel occlusion or proximal stenosis. No
aneurysm or vascular malformation.

Posterior circulation: Both vertebral arteries widely patent to the
basilar. No basilar stenosis. Posterior circulation branch vessels
are normal.

Venous sinuses: Normal

Anatomic variants: None significant.

Review of the MIP images confirms the above findings
IMPRESSION: Normal CT angiography of the head and neck. No evidence of
atherosclerotic disease or dissection. No large vessel occlusion or
stenosis. No aneurysm or vascular malformation.

## 2021-12-22 IMAGING — MR MR HEAD W/O CM
11 series · 48 of 48 positions shown · non-contrast
Comparison: Head CT and CT angiography same day

CLINICAL DATA: Neuro deficit, acute, stroke suspected. Left-sided
numbness and weakness.

EXAM:
MRI HEAD WITHOUT CONTRAST
TECHNIQUE: Multiplanar, multiecho pulse sequences of the brain and surrounding
structures were obtained without intravenous contrast.

[Series 5: ax dwi_tracew · axial · 3.0mm · 0.71mm/px · z∈[-94,+70]mm · 5 of 56 slices shown]
[im 1/56]
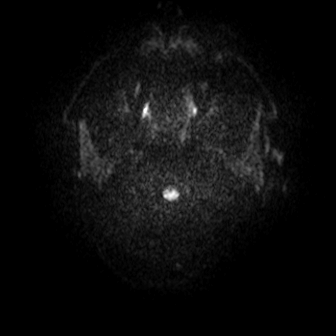
[im 14/56]
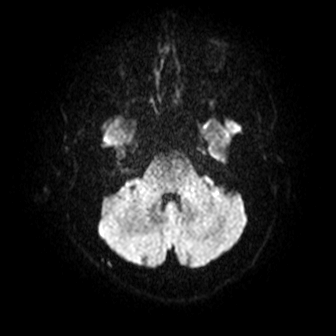
[im 28/56]
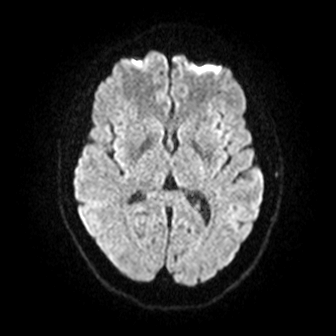
[im 42/56]
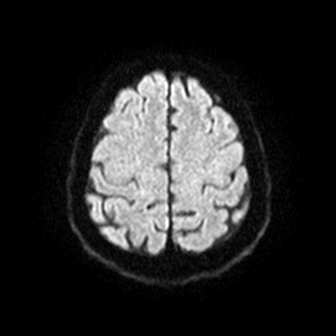
[im 56/56]
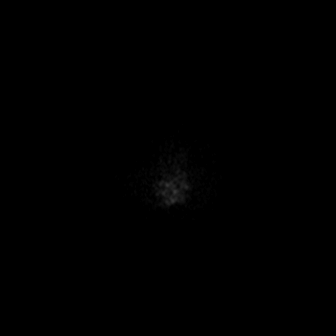

[Series 6: ax dwi_adc · axial · 3.0mm · 0.71mm/px · z∈[-94,+70]mm · 5 of 56 slices shown]
[im 1/56]
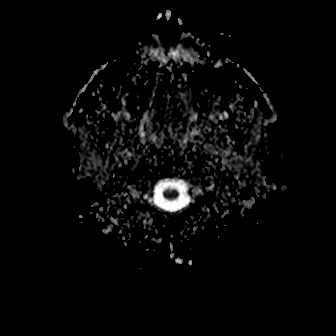
[im 14/56]
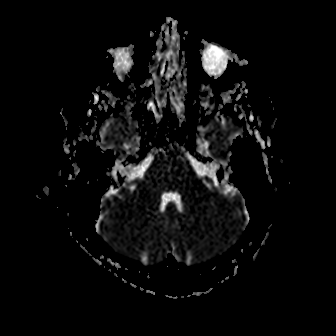
[im 28/56]
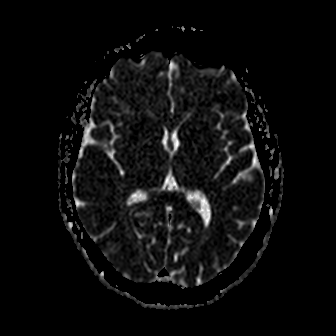
[im 42/56]
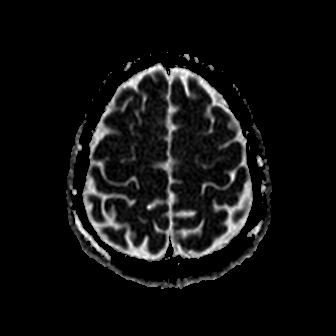
[im 56/56]
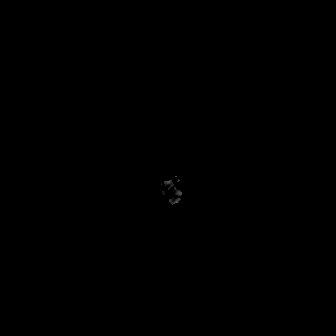

[Series 7: cor dwi_tracew · coronal · 5.0mm · 0.68mm/px · 4 of 40 slices shown]
[im 1/40]
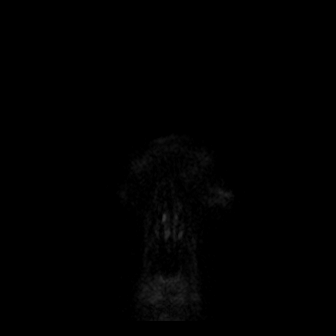
[im 14/40]
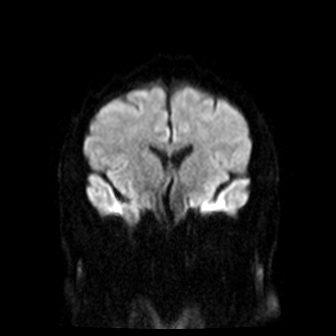
[im 27/40]
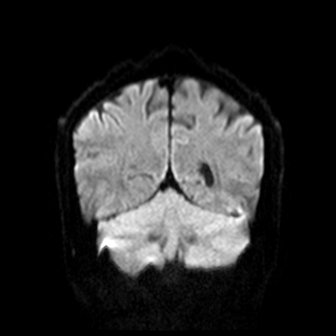
[im 40/40]
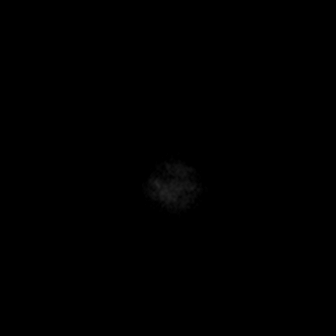

[Series 8: cor dwi_adc · coronal · 5.0mm · 0.68mm/px · 3 of 40 slices shown]
[im 1/40]
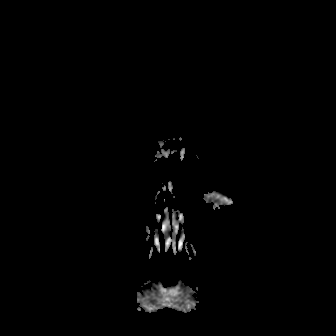
[im 20/40]
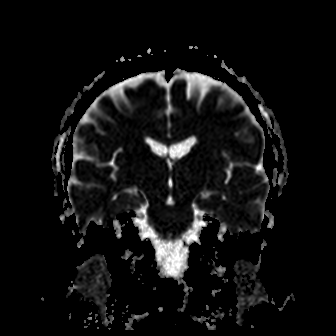
[im 40/40]
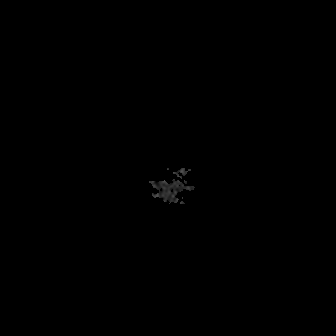

[Series 9: T1 · sagittal · 5.0mm · 0.47mm/px · 2 of 22 slices shown (1 of 2)]
[im 1/22]
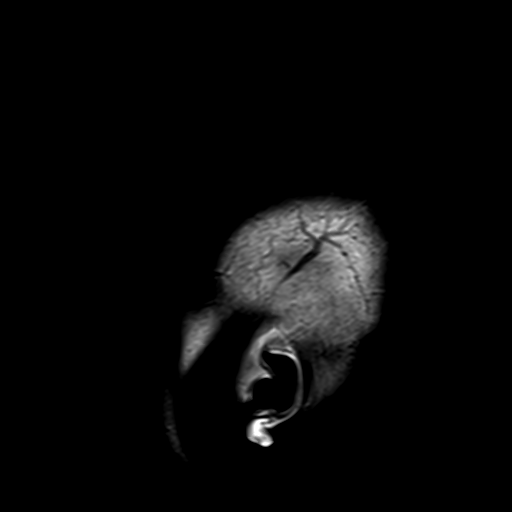
[im 22/22]
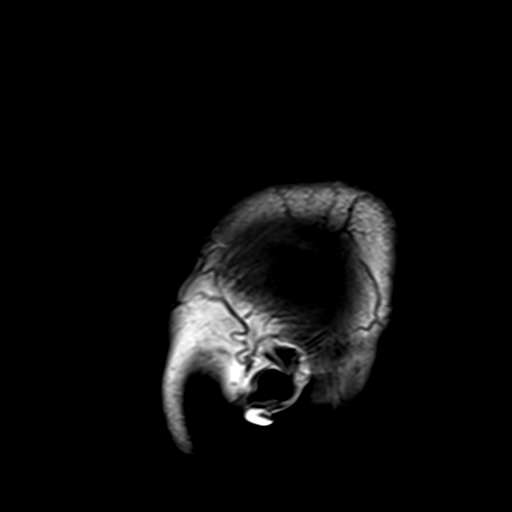

[Series 10: T2 · axial · 5.0mm · 0.86mm/px · z∈[-89,+66]mm · 2 of 27 slices shown (1 of 2)]
[im 1/27]
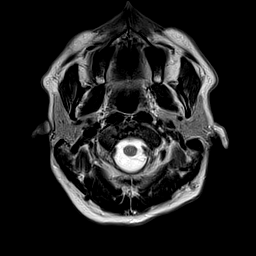
[im 27/27]
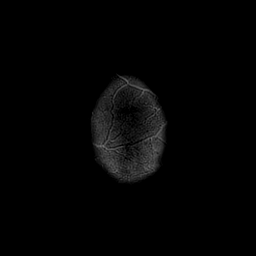

[Series 12: ax swi_pha · axial · 3.0mm · 0.90mm/px · z∈[-94,+70]mm · 4 of 56 slices shown]
[im 1/56]
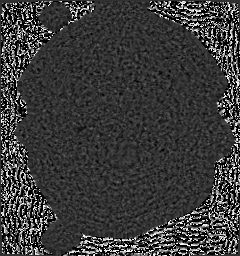
[im 19/56]
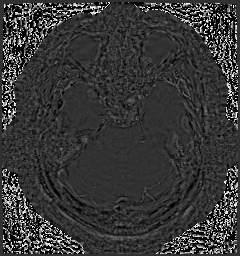
[im 37/56]
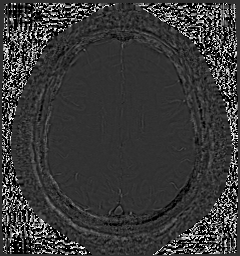
[im 56/56]
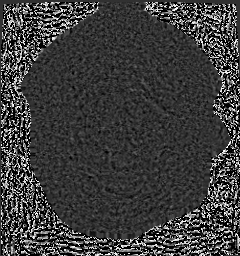

[Series 13: ax swi_swi · axial · 3.0mm · 0.90mm/px · z∈[-94,+70]mm · 4 of 56 slices shown]
[im 1/56]
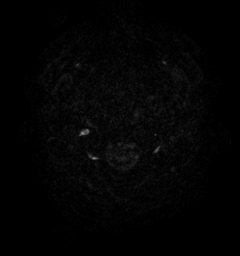
[im 19/56]
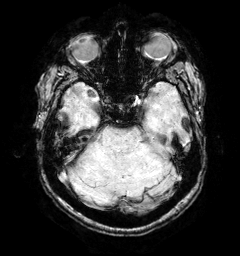
[im 37/56]
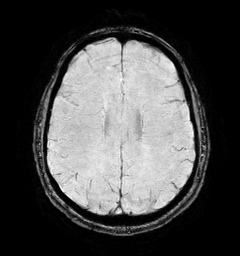
[im 56/56]
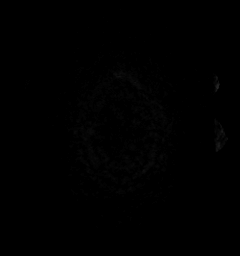

[Series 15: FLAIR · axial · 3.0mm · 0.69mm/px · z∈[-92,+69]mm · 4 of 55 slices shown]
[im 1/55]
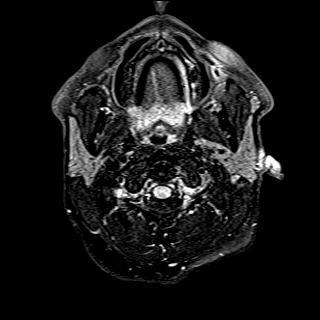
[im 19/55]
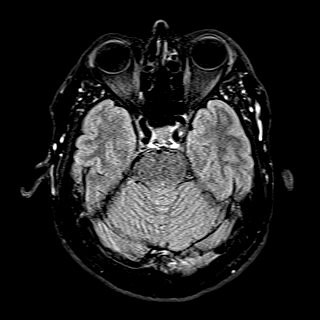
[im 37/55]
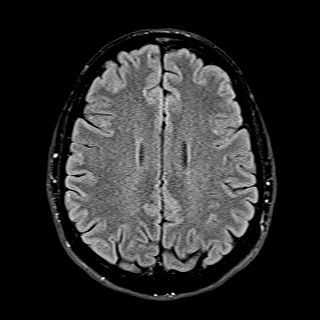
[im 55/55]
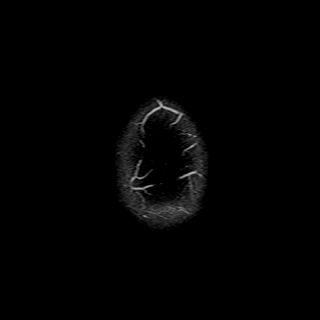

[Series 16: T1 · axial · 1.0mm · 0.98mm/px · z∈[-92,+66]mm · 13 of 160 slices shown (2 of 2)]
[im 1/160]
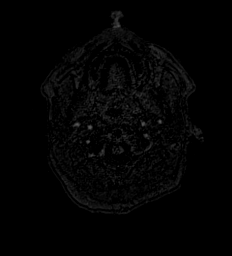
[im 14/160]
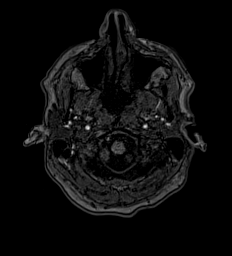
[im 27/160]
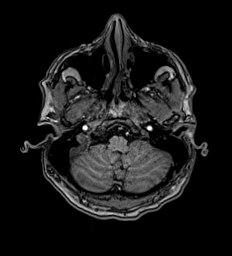
[im 40/160]
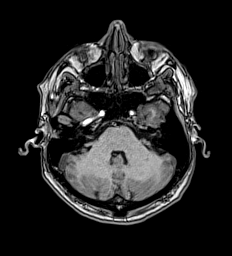
[im 54/160]
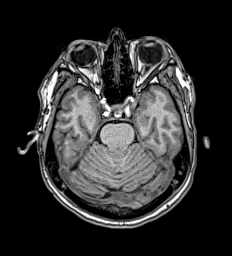
[im 67/160]
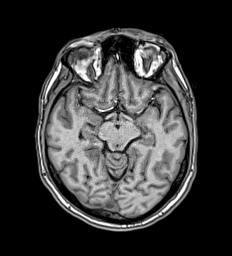
[im 80/160]
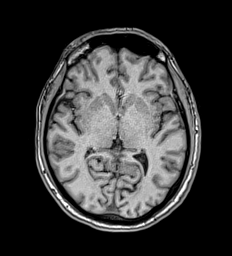
[im 93/160]
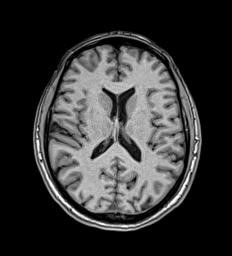
[im 107/160]
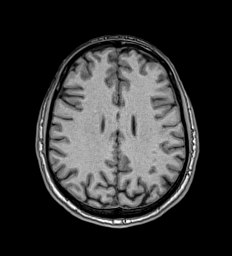
[im 120/160]
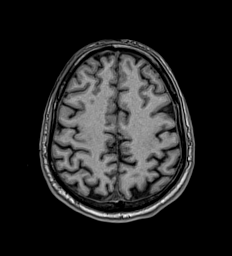
[im 133/160]
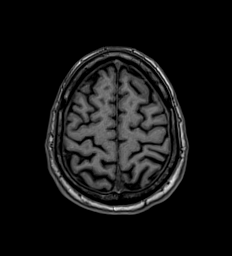
[im 146/160]
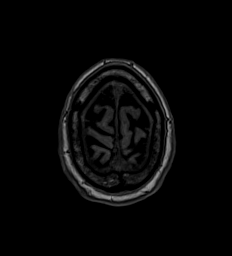
[im 160/160]
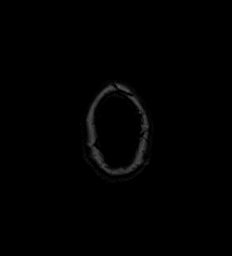

[Series 17: T2 · coronal · 5.0mm · 0.86mm/px · 2 of 30 slices shown (2 of 2)]
[im 1/30]
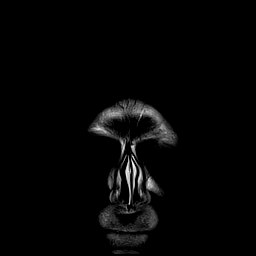
[im 30/30]
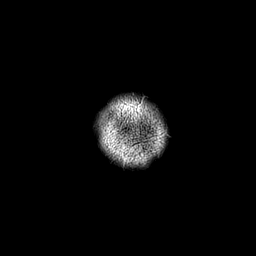

[48 of 48 positions shown; findings below may reference images not displayed]

FINDINGS: Brain: The brain has a normal appearance without evidence of
malformation, atrophy, old or acute small or large vessel
infarction, mass lesion, hemorrhage, hydrocephalus or extra-axial
collection.

Vascular: Major vessels at the base of the brain show flow. Venous
sinuses appear patent.

Skull and upper cervical spine: Normal.

Sinuses/Orbits: Clear/normal.

Other: None significant.
IMPRESSION: Normal examination.  No old or acute insult visible.

## 2021-12-22 IMAGING — CT CT HEAD CODE STROKE
4 series · 17 of 47 positions shown, 19 images · non-contrast
Comparison: [DATE]

CLINICAL DATA: Code stroke.  Left-sided weakness



[Series 6: head wo · axial · 0.47mm/px · z∈[-60,+60]mm · 7 of 34 slices shown, 9 images]
[im 5/34  brain]
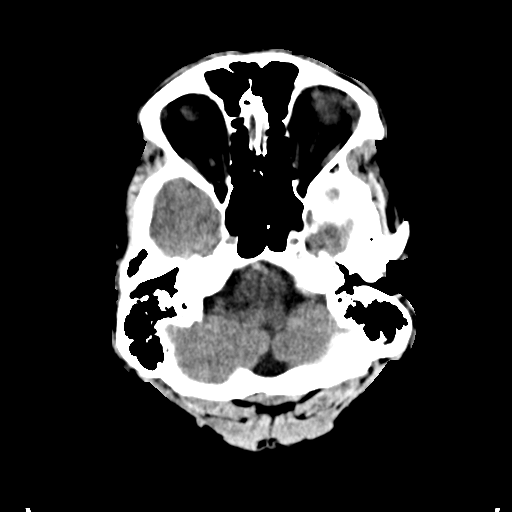
[im 5/34  bone]
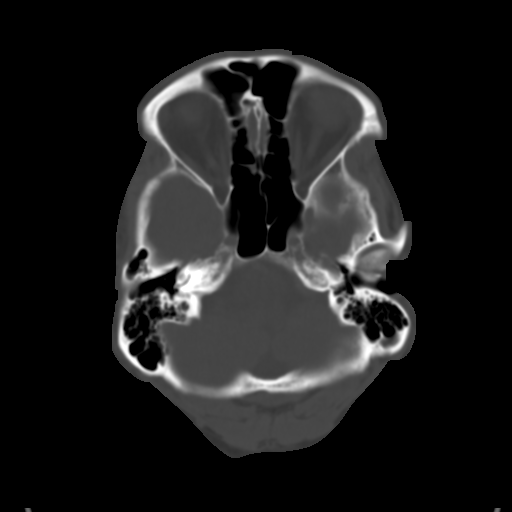
[im 9/34  brain]
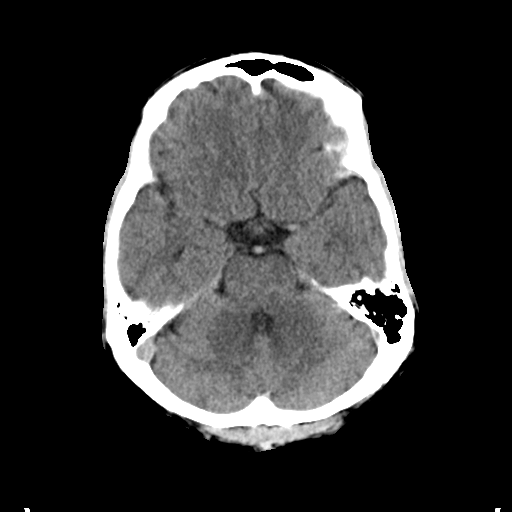
[im 13/34  brain]
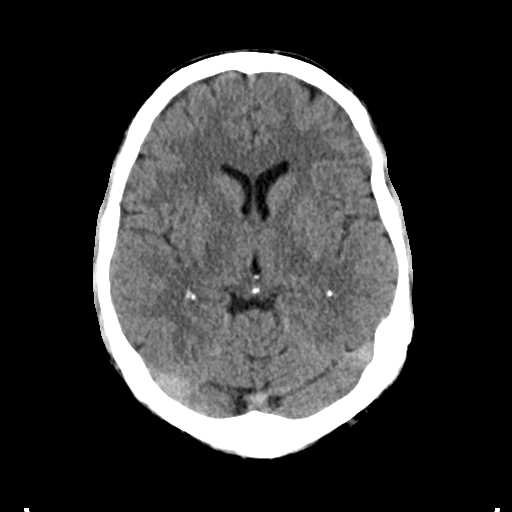
[im 17/34  brain]
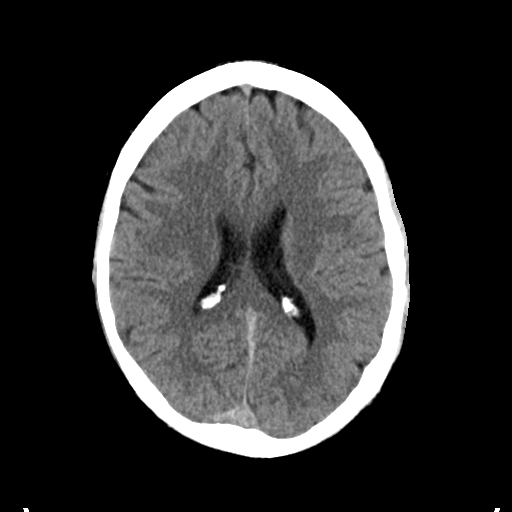
[im 21/34  brain]
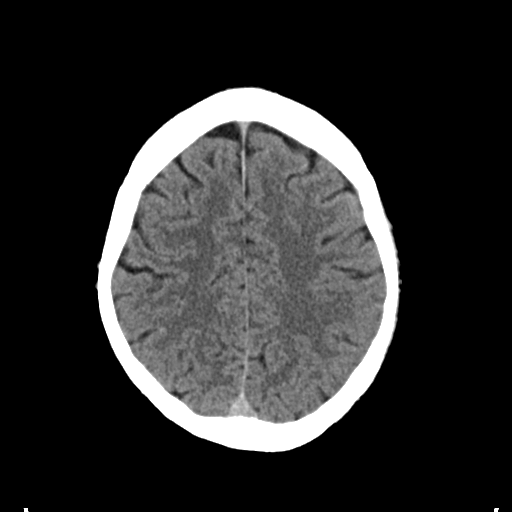
[im 21/34  bone]
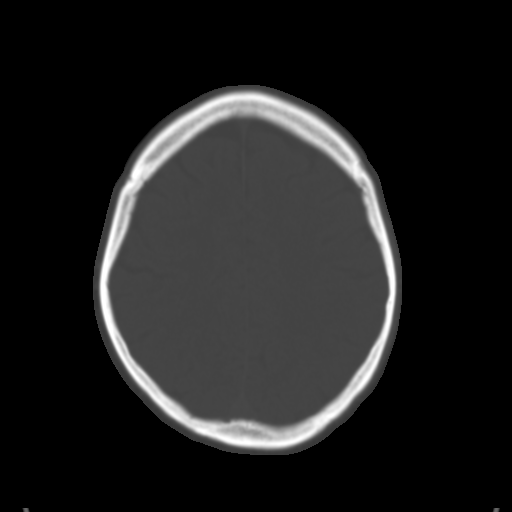
[im 25/34  brain]
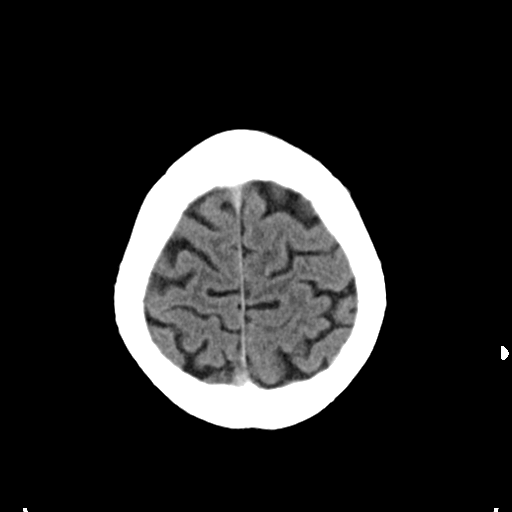
[im 29/34  brain]
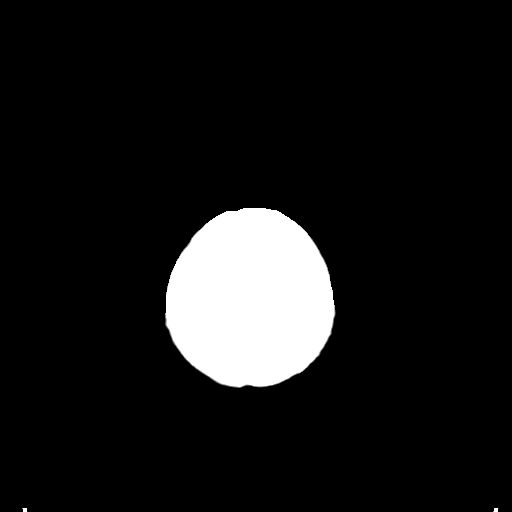

[Series 7: head bone · axial · 0.47mm/px · z∈[-64,-6]mm · 4 of 84 slices shown]
[im 9/84  bone]
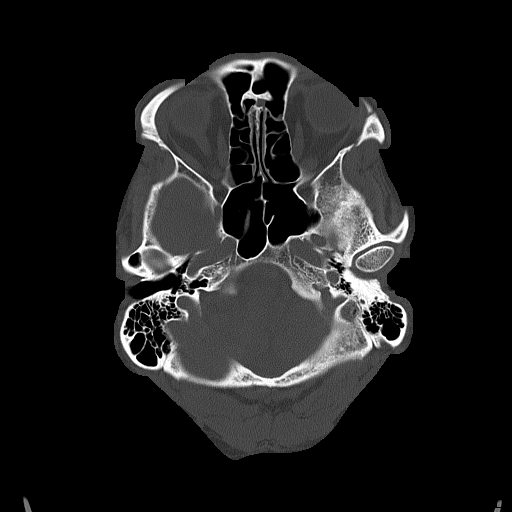
[im 17/84  bone]
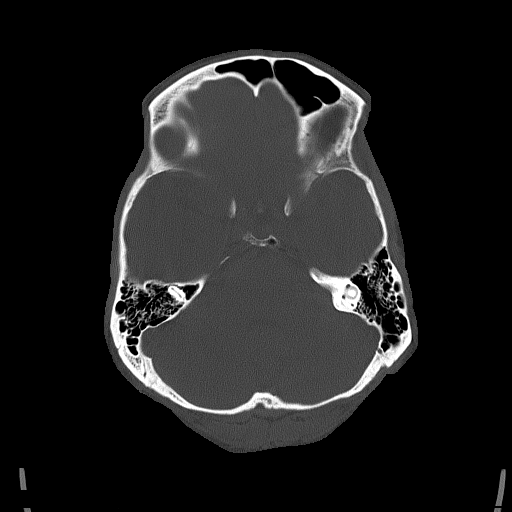
[im 25/84  bone]
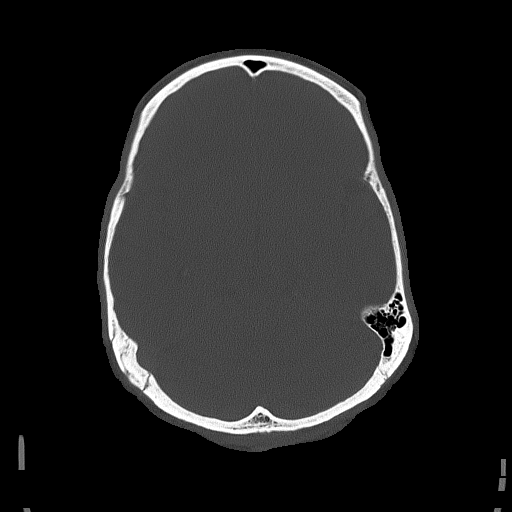
[im 38/84  bone]
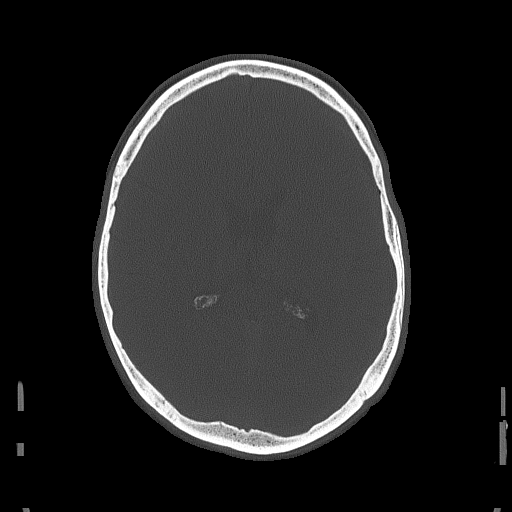

[Series 8: coronal soft tissue · coronal · 0.33mm/px · 3 of 67 slices shown]
[im 23/67  brain]
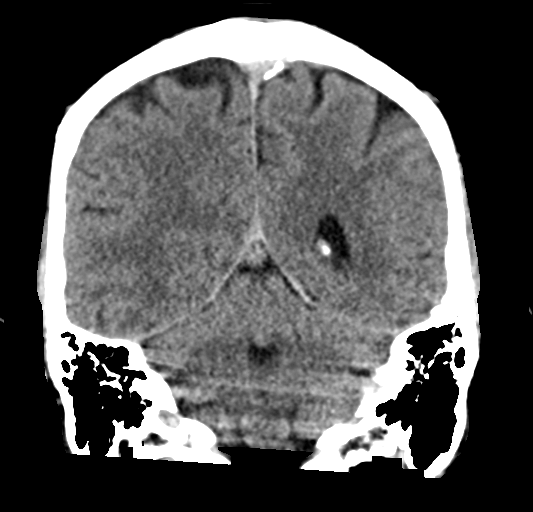
[im 30/67  brain]
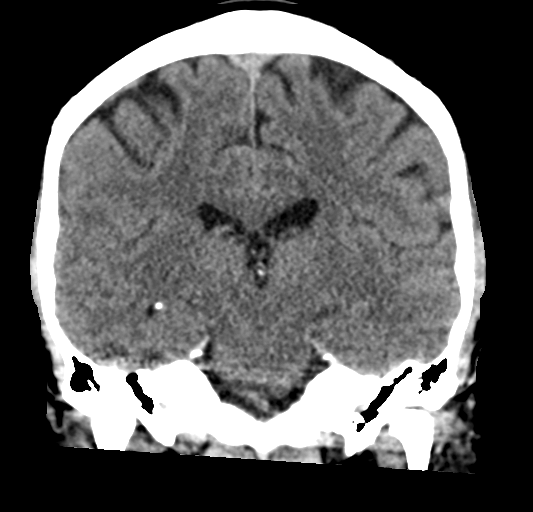
[im 37/67  brain]
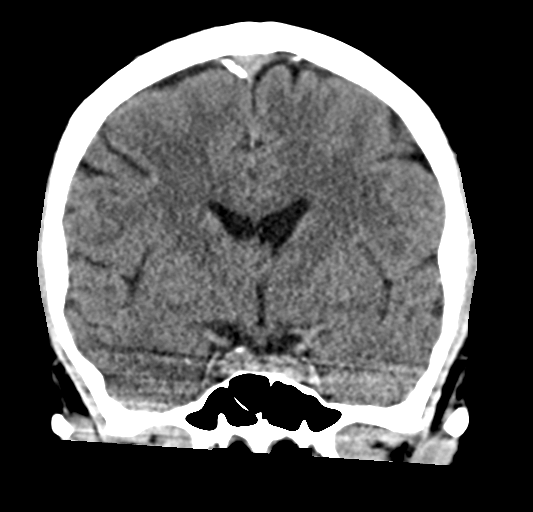

[Series 9: sagittal soft tissue · sagittal · 0.33mm/px · 3 of 60 slices shown]
[im 20/60  brain]
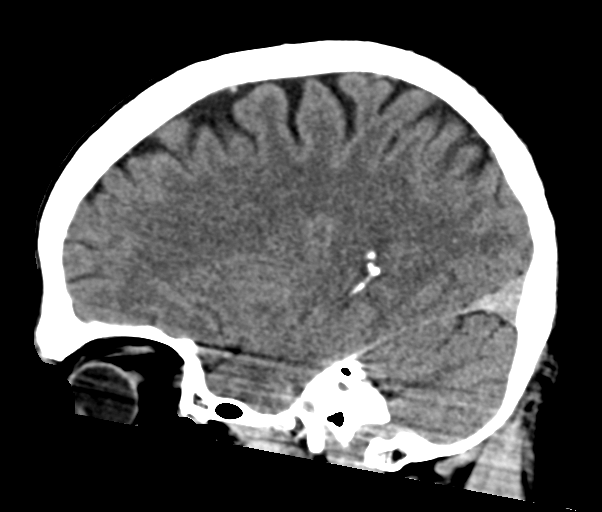
[im 30/60  brain]
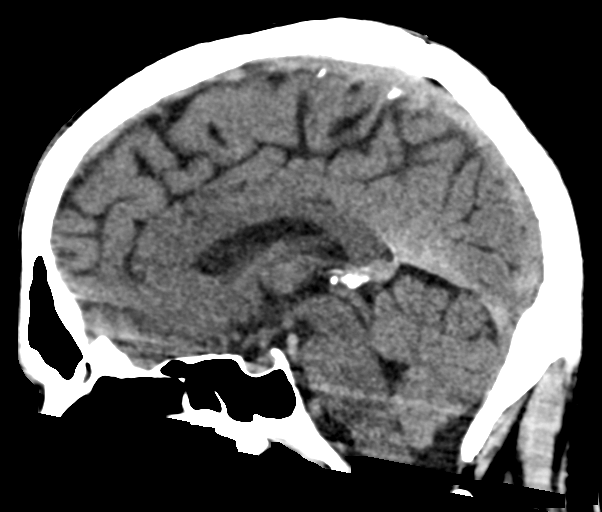
[im 40/60  brain]
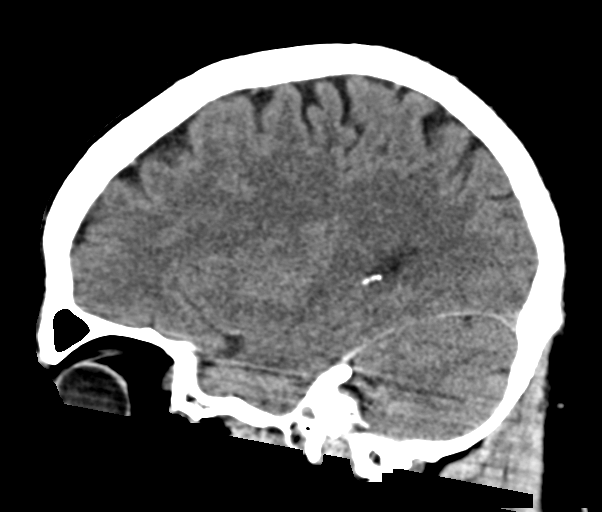

[17 of 47 positions shown; findings below may reference images not displayed]

FINDINGS: Brain: No evidence of acute infarction, hemorrhage, hydrocephalus,
extra-axial collection or mass lesion/mass effect.

Vascular: No hyperdense vessel or unexpected calcification.

Skull: Normal. Negative for fracture or focal lesion.

Sinuses/Orbits: No acute finding.

Other: These results were communicated to Dr. CRVENKO at [DATE] on
[DATE] by text page via the [employer] messaging system.

ASPECTS ([place] Stroke Program Early CT Score)

- Ganglionic level infarction (caudate, lentiform nuclei, internal
capsule, insula, M1-M3 cortex):

- Supraganglionic infarction (M4-M6 cortex):

Total score (0-10 with 10 being normal):
IMPRESSION: Negative head CT.

## 2021-12-22 MED ORDER — PREDNISONE 10 MG (21) PO TBPK: ORAL_TABLET | ORAL | 0 refills | Status: DC

## 2021-12-22 MED ORDER — SODIUM CHLORIDE 0.9 % IV BOLUS
1000.0000 mL | Freq: Once | INTRAVENOUS | Status: AC
  Administered 2021-12-22: 1000 mL via INTRAVENOUS

## 2021-12-22 MED ORDER — ONDANSETRON HCL 4 MG/2ML IJ SOLN
4.0000 mg | Freq: Once | INTRAMUSCULAR | Status: AC
  Administered 2021-12-22: 4 mg via INTRAVENOUS
  Filled 2021-12-22: qty 2

## 2021-12-22 MED ORDER — SODIUM CHLORIDE 0.9% FLUSH: 3.0000 mL | Freq: Once | INTRAVENOUS | Status: DC

## 2021-12-22 MED ORDER — IOHEXOL 350 MG/ML SOLN
150.0000 mL | Freq: Once | INTRAVENOUS | Status: AC | PRN
  Administered 2021-12-22: 150 mL via INTRAVENOUS

## 2021-12-22 MED ORDER — DICYCLOMINE HCL 10 MG/ML IM SOLN
20.0000 mg | Freq: Once | INTRAMUSCULAR | Status: DC
  Filled 2021-12-22: qty 2

## 2021-12-22 MED ORDER — METHYLPREDNISOLONE SODIUM SUCC 125 MG IJ SOLR
125.0000 mg | INTRAMUSCULAR | Status: AC
  Administered 2021-12-22: 125 mg via INTRAVENOUS
  Filled 2021-12-22: qty 2

## 2021-12-22 NOTE — Discharge Instructions (Addendum)
MRI of your brain today showed: ?IMPRESSION: ?Normal examination.  No old or acute insult visible. ? ?Your CT of your Head and Neck today showed: ?IMPRESSION: ?Normal CT angiography of the head and neck. No evidence of ?atherosclerotic disease or dissection. No large vessel occlusion or ?stenosis. No aneurysm or vascular malformation. ? ?Your CT of your Chest Abdomen Pelvis today showed: ?FINDINGS: ?CTA CHEST FINDINGS ?  ?Cardiovascular: The heart size is normal. No substantial pericardial ?effusion. Pre contrast imaging shows no hyperdense crescent in the ?wall of the thoracic aorta to suggest acute intramural hematoma. No ?thoracic aortic aneurysm. No dissection of the thoracic aorta. No ?large central pulmonary embolus in the main pulmonary arteries or ?lobar pulmonary arteries to either lung. ?  ?Mediastinum/Nodes: No mediastinal lymphadenopathy. There is no hilar ?lymphadenopathy. The esophagus has normal imaging features. There is ?no axillary lymphadenopathy. ?  ?Lungs/Pleura: Trace biapical pleuroparenchymal scarring evident. No ?suspicious pulmonary nodule or mass. No focal airspace ?consolidation. There is no evidence of pleural effusion. ?  ?Musculoskeletal: No worrisome lytic or sclerotic osseous ?abnormality. ?  ?Review of the MIP images confirms the above findings. ?  ?CTA ABDOMEN AND PELVIS FINDINGS ?  ?VASCULAR ?  ?Aorta: Normal caliber aorta without aneurysm, dissection, vasculitis ?or significant stenosis. ?  ?Celiac: Patent without evidence of aneurysm, dissection, vasculitis ?or significant stenosis. ?  ?SMA: Patent without evidence of aneurysm, dissection, vasculitis or ?significant stenosis. ?  ?Renals: Both renal arteries are patent without evidence of aneurysm, ?dissection, vasculitis, fibromuscular dysplasia or significant ?stenosis. Accessory renal arteries noted bilaterally. ?  ?IMA: Patent without evidence of aneurysm, dissection, vasculitis or ?significant stenosis. ?  ?Inflow: Patent  without evidence of aneurysm, dissection, vasculitis ?or significant stenosis. ?  ?Veins: No obvious venous abnormality within the limitations of this ?arterial phase study. ?  ?Review of the MIP images confirms the above findings. ?  ?NON-VASCULAR ?  ?Hepatobiliary: No suspicious focal abnormality within the liver ?parenchyma. There is no evidence for gallstones, gallbladder wall ?thickening, or pericholecystic fluid. No intrahepatic or ?extrahepatic biliary dilation. ?  ?Pancreas: No focal mass lesion. No dilatation of the main duct. No ?intraparenchymal cyst. No peripancreatic edema. ?  ?Spleen: No splenomegaly. No focal mass lesion. ?  ?Adrenals/Urinary Tract: No adrenal nodule or mass. Kidneys ?unremarkable. No evidence for hydroureter. The urinary bladder ?appears normal for the degree of distention. ?  ?Stomach/Bowel: Stomach is unremarkable. No gastric wall thickening. ?No evidence of outlet obstruction. Duodenum is normally positioned ?as is the ligament of Treitz. No small bowel wall thickening. No ?small bowel dilatation. The terminal ileum is normal. The appendix ?is not well visualized, but there is no edema or inflammation in the ?region of the cecum. No gross colonic mass. No colonic wall ?thickening. ?  ?Lymphatic: There is no gastrohepatic or hepatoduodenal ligament ?lymphadenopathy. No retroperitoneal or mesenteric lymphadenopathy. ?No pelvic sidewall lymphadenopathy. ?  ?Reproductive: The prostate gland and seminal vesicles are ?unremarkable. ?  ?Other: No intraperitoneal free fluid. ?  ?Musculoskeletal: No worrisome lytic or sclerotic osseous ?abnormality. ?  ?Review of the MIP images confirms the above findings. ?  ?IMPRESSION: ?1. No evidence for thoracoabdominal aortic aneurysm or dissection. ?No findings to suggest acute intramural hematoma within the thoracic ?aorta. ?2. No acute findings in the chest, abdomen, or pelvis. ?3. Reported history kidney stones. Renal calices obscured on  the ?current study due to excretion of contrast material from CTA ?head/neck performed immediately prior to this study. ?

## 2021-12-22 NOTE — ED Provider Notes (Signed)
I assumed care of this patient at approximately 1500.  Please see outgoing progress note for full details guarding patient's initial evaluation and assessment.  In brief patient presents initially for evaluation of some abdominal and mid back pain but in triage was noted to have some left-sided weakness and paresthesias.  He underwent code stroke as well as CT of the chest abdomen pelvis.  CTs of the head, neck as well as chest and pelvis and MRI brain are all negative.  He is feeling better on my reassessment.  Plan is to follow-up neurology final recs.  Neurology is low suspicion for PE at this time and patient may follow-up with his outpatient providers.  I discussed this with patient he is amenable with plan.  Discharged in stable condition. ?  ?Lucrezia Starch, MD ?12/22/21 1609 ? ?

## 2021-12-22 NOTE — ED Notes (Signed)
Pt in CT at this time.  Family at bedside 

## 2021-12-22 NOTE — ED Triage Notes (Signed)
Pt to ED for pulsing mid lower abdominal pain that radiates to both flanks since today. Had chest pain yesterday. Has back pain since last week. ?Pt complains of L arm and L leg numbness since 1 hour ago. ? ?Upon starting NIH assessment, pt noted to have R facial droop and R facial numbness. ? ?Called Cn, will call code stroke.  CBG 129. ?

## 2021-12-22 NOTE — ED Notes (Signed)
Urine sample sent to lab

## 2021-12-22 NOTE — Consult Note (Signed)
Neurology Consultation ?Reason for Consult: Code stroke ?Requesting Physician: Dr. Sharman CheekPhillip Stafford ? ?CC: Abdominal and back pain ? ?History is obtained from: Patient and chart review  ? ?HPI: Jay BarterMichael Mcneil is a 51 y.o. male with a PMHx of Chron's disease (not on any meds), bipolar disorder (not on any meds). ? ?He was recently seen on 4/6 for low back pain associated with a weak urinary stream, and discharged with a prednisone taper, oxycodone and methocarbamol, though unclear at this time if he was using any of these medication yet.  He had plan for follow-up with his PCP but due to worsening pain as well as some rattling in his chest he decided to present to the ED for further evaluation.  In route he began to have left-sided numbness in his arm and leg for which code stroke was activated with triage nurse also concerned for right facial droop ? ?Similar pain in 2021 per chart review ? ?LKW: 10:15 AM ?tPA given?: No, MRI negative for stroke and symptoms variable/inconsistent ?IA performed?: No, exam and imaging not consistent with LVO ?Premorbid modified rankin scale: 0-1 ? ? ?ROS: All other review of systems was negative except as noted in the HPI and below:  ?Double vision, longstanding but more frequently lately ?Abdominal pain has worsened since 4/6 ?Difficulty passing urine (weak stream, has to stand)  ?Feels like he has rattling in his left chest  ? ?Past Medical History:  ?Diagnosis Date  ? Bipolar disorder (HCC)   ? Crohn's disease (HCC)   ? Kidney stone   ? ?Past Surgical History:  ?Procedure Laterality Date  ? perianal surgery    ? ?No family history on file. ?Not obtained in the acute setting ? ?Social History:  reports that he has quit smoking. He has never used smokeless tobacco. He reports current alcohol use. No history on file for drug use. ? ? ?Exam: ?Current vital signs: ?BP (!) 142/85   Pulse (!) 105   Resp 16   SpO2 100%  ?Vital signs in last 24 hours: ?Pulse Rate:  [105] 105 (04/09  1112) ?Resp:  [16] 16 (04/09 1112) ?BP: (142-153)/(85-116) 142/85 (04/09 1135) ?SpO2:  [100 %] 100 % (04/09 1112) ? ? ?Physical Exam  ?Constitutional: Appears well-developed and well-nourished. Grimacing frequently in pain  ?Psych: Affect highly anxious, cooperative.  ?Eyes: No scleral injection ?HENT: No oropharyngeal obstruction.  ?MSK: no joint deformities.  ?Cardiovascular: Normal rate and regular rhythm. Perfusing extremities well ?Respiratory: Tachypenic  ?GI: Soft. Normal Bowel sounds, slightly tender to palpation, no gaurding ?Skin: Warm dry and intact visible skin ? ?Neuro: ?Mental Status: ?Patient is awake, alert, oriented to person, place, month, and situation. ?Patient is able to give a clear and coherent history, sometimes limited by pain ?No signs of aphasia or neglect ?Cranial Nerves: ?II: Visual Fields are full. Pupils are equal, round, and reactive to light.   ?III,IV, VI: EOMI, stable lateral diplopia  ?V: Facial sensation is reduced symmetrically in V1 and V3 but not V2 ?VII: Facial movement is symmetric (improved right facial droop per Triage nurse) ?VIII: hearing is intact to voice ?X: Uvula elevates symmetrically ?XII: tongue is midline without atrophy or fasciculations.  ?Motor: ?Tone is normal. Bulk is normal. 5/5 strength was present in all four extremities, other than mildly reduced grip strength on the left hand and giveway weakness of the left hip flexor, at least 4+/5 at best ?Sensory: ?Sensation reduced in the left arm and leg as well as odd pattern in face  as above ?Deep Tendon Reflexes: ?2+ and symmetric in the brachioradialis and patellae.  ?Cerebellar: ?FNF and HKS are intact other than RUE tremor which he reports is new ?Gait:  ?Stands to urinate stable on both legs.  When ambulating from scanner to chair he has some variable inconsistent buckling of the left leg, but if there is any weakness it is mild as he is able to maintain his weight on it very well as he transitions to  sitting with the right foot off the floor ? ?NIHSS total 4 ?Score breakdown: One-point for left upper extremity drift, one-point for left lower extremity drift, one-point for right upper extremity ataxia, one-point for sensory loss in the left arm and left leg and bilaterally in V1 and V3 in the face ? ?Performed while patient was in CT scanner (11:30 AM) ? ?I have reviewed labs in epic and the results pertinent to this consultation are: ? ?Last metabolic panel ?Lab Results  ?Component Value Date  ? GLUCOSE 120 (H) 12/22/2021  ? NA 139 12/22/2021  ? K 3.8 12/22/2021  ? CL 108 12/22/2021  ? CO2 22 12/22/2021  ? BUN 20 12/22/2021  ? CREATININE 1.04 12/22/2021  ? GFRNONAA >60 12/22/2021  ? CALCIUM 9.7 12/22/2021  ? PROT 7.6 12/22/2021  ? ALBUMIN 4.4 12/22/2021  ? BILITOT 0.6 12/22/2021  ? ALKPHOS 48 12/22/2021  ? AST 30 12/22/2021  ? ALT 36 12/22/2021  ? ANIONGAP 9 12/22/2021  ? ? ?CBC: ?Recent Labs  ?Lab 12/19/21 ?1226 12/22/21 ?1116  ?WBC 11.3* 7.3  ?NEUTROABS 7.7 4.3  ?HGB 15.9 15.9  ?HCT 46.0 45.1  ?MCV 86.0 85.7  ?PLT 266 253  ? ? ?Coagulation Studies: ?Recent Labs  ?  12/22/21 ?1116  ?LABPROT 13.0  ?INR 1.0  ?  ?Lab Results  ?Component Value Date  ? TROPONINI <0.03 07/17/2017  ? ? ? ?I have reviewed the images obtained: ?Head CT personally reviewed, agree with radiology negative for acute intracranial process ?CTA personally reviewed, agree with radiology negative for LVO ?MRI brain personally reviewed, negative for diffusion restriction, full radiology read pending ? ? ?Impression: This is a 51 year old gentleman with variable neurological examination most consistent with conversion/functional deficits, however he is within a thrombolytic treatment window, and therefore MRI brain was proceeded to rule out acute intracranial process with CTA pursued to rule out dissection that could be complicating to an acute intracranial process. ? ?Code stroke recommendations: ? ?#Left-sided numbness ?-CTA head and neck as  well as chest/abdomen/pelvis to rule out dissection ?-MRI brain to rule out acute stroke ?-Given these studies are negative, further work-up can be completed on an outpatient basis given no disabling neurological symptoms ? ?#Chest pain ?-Troponin and EKG obtained prior to going to MRI ?-Remainder of work-up per ED provider ? ? ?Brooke Dare MD-PhD ?[facility] ?[phone] ?[facility] coverage for [facility] is from 8 AM to 4 AM in-house and 4 PM to 8 PM by telephone/video. 8 PM to 8 AM emergent questions or overnight urgent questions should be addressed to Teleneurology On-call or [facility] neurohospitalist; contact information can be found on AMION ? ?Total critical care time: 65 minutes ?  ?Critical care time was exclusive of separately billable procedures and treating other patients. ?  ?Critical care was necessary to treat or prevent imminent or life-threatening deterioration, specifically acute neurological changes with potential to treat with thrombolytic ?  ?Critical care was time spent personally by me on the following activities: development of treatment plan with patient  and/or surrogate as well as nursing, discussions with consultants/primary team, evaluation of patient's response to treatment, examination of patient, obtaining history from patient or surrogate, ordering and performing treatments and interventions, ordering and review of laboratory studies, ordering and review of radiographic studies, and re-evaluation of patient's condition as needed, as documented above.  ?  ? ?

## 2021-12-22 NOTE — ED Notes (Signed)
Green button pressed at this time., Misty RN on screen and all information provided at this time.  ?

## 2021-12-22 NOTE — ED Provider Notes (Signed)
? ?[facility] ?Provider Note ? ? ? Event Date/Time  ? First MD Initiated Contact with Patient 12/22/21 1123   ?  (approximate) ? ? ?History  ? ?Abdominal Pain, Flank Pain, and Nausea ? ? ?HPI ? ?Jay Mcneil is a 51 y.o. male with a history of Crohn's disease, kidney stones, bipolar disorder, recurrent rectal fistulas who comes to the ED complaining of throbbing lower abdominal pain radiating to his back.  This has been ongoing for the past few days, worsening.  No aggravating or alleviating factors.  Feels worse than previous kidney stones.  No lower extremity weakness or paresthesia. ? ?On arrival to the ED, he had also reported left arm and left leg numbness that started at about 10:15 AM today.  He was noted in triage to have facial droop and a code stroke was initiated. ? ?He was seen right away by neurology Dr. Iver Nestle who obtained CT head followed by CT angiogram of the head neck chest abdomen pelvis and MRI of the brain. ? ?Initially he had an NIH stroke scale of 4 due to paresthesia of the left arm and left leg along with some drift of the left arm and left leg.  Currently he feels that his strength is normal, and he has only some altered sensation along the medial aspect of the left arm and the medial aspect of the left leg. ?  ? ? ?Physical Exam  ? ?Triage Vital Signs: ?ED Triage Vitals [12/22/21 1112]  ?Enc Vitals Group  ?   BP (!) 153/116  ?   Pulse Rate (!) 105  ?   Resp 16  ?   Temp   ?   Temp src   ?   SpO2 100 %  ?   Weight   ?   Height   ?   Head Circumference   ?   Peak Flow   ?   Pain Score   ?   Pain Loc   ?   Pain Edu?   ?   Excl. in GC?   ? ? ?Most recent vital signs: ?Vitals:  ? 12/22/21 1330 12/22/21 1400  ?BP: 109/75 107/78  ?Pulse: 71 67  ?Resp: 19 15  ?SpO2: 98% 95%  ? ? ? ?General: Awake, no distress.  ?CV:  Good peripheral perfusion.  Regular rate and rhythm, heart rate 80 ?Resp:  Normal effort.  Clear to auscultation bilaterally ?Abd:  No distention.  Soft and  nontender ?Other:  No drift, motor intact and symmetric.  Coordination intact, language and reasoning intact, stroke scale 0 ? ? ?ED Results / Procedures / Treatments  ? ?Labs ?(all labs ordered are listed, but only abnormal results are displayed) ?Labs Reviewed  ?COMPREHENSIVE METABOLIC PANEL - Abnormal; Notable for the following components:  ?    Result Value  ? Glucose, Bld 120 (*)   ? All other components within normal limits  ?LACTIC ACID, PLASMA - Abnormal; Notable for the following components:  ? Lactic Acid, Venous 2.4 (*)   ? All other components within normal limits  ?URINALYSIS, ROUTINE W REFLEX MICROSCOPIC - Abnormal; Notable for the following components:  ? Color, Urine STRAW (*)   ? APPearance CLEAR (*)   ? All other components within normal limits  ?CBG MONITORING, ED - Abnormal; Notable for the following components:  ? Glucose-Capillary 129 (*)   ? All other components within normal limits  ?PROTIME-INR  ?APTT  ?CBC  ?DIFFERENTIAL  ?LACTIC ACID, PLASMA  ?  CBG MONITORING, ED  ?I-STAT CREATININE, ED  ?TROPONIN I (HIGH SENSITIVITY)  ? ? ? ?EKG ? ?Interpreted by me ?Normal sinus rhythm rate of 80.  Normal axis and intervals.  Normal QRS ST segments and T waves ? ? ?RADIOLOGY ?CT head viewed and interpreted by me, negative for mass or intracranial hemorrhage.  Radiology report reviewed. ? ?CT angiogram of the head and neck negative for acute findings such as dissection or aneurysm. ? ?CT chest abdomen pelvis negative for aortic dissection or other acute findings.  No ureteral obstruction. ? ? ? ?PROCEDURES: ? ?Critical Care performed: No ? ?Procedures ? ? ?MEDICATIONS ORDERED IN ED: ?Medications  ?sodium chloride flush (NS) 0.9 % injection 3 mL (3 mLs Intravenous Not Given 12/22/21 1317)  ?dicyclomine (BENTYL) injection 20 mg (has no administration in time range)  ?iohexol (OMNIPAQUE) 350 MG/ML injection 150 mL (150 mLs Intravenous Contrast Given 12/22/21 1145)  ?sodium chloride 0.9 % bolus 1,000 mL (1,000 mLs  Intravenous New Bag/Given 12/22/21 1313)  ?methylPREDNISolone sodium succinate (SOLU-MEDROL) 125 mg/2 mL injection 125 mg (125 mg Intravenous Given 12/22/21 1316)  ?ondansetron (ZOFRAN) injection 4 mg (4 mg Intravenous Given 12/22/21 1316)  ? ? ? ?IMPRESSION / MDM / ASSESSMENT AND PLAN / ED COURSE  ?I reviewed the triage vital signs and the nursing notes. ?             ?               ? ?Differential diagnosis includes, but is not limited to, AAA, aortic dissection, pneumothorax, ureterolithiasis, cystitis, Crohn's colitis, musculoskeletal low back pain ? ?**The patient is on the cardiac monitor to evaluate for evidence of arrhythmia and/or significant heart rate changes.**} ? ?Patient presents with low back pain and abdominal pain.  Also found to have neurologic symptoms which were evaluated by neurology under code stroke protocol.  Imaging is all reassuring including MRI brain which is negative. ? ?Symptoms appear to be acute on chronic.  We will give Solu-Medrol Zofran and Bentyl to attempt to relieve his symptoms while awaiting final neurology recommendations.  His neuro symptoms seem to have resolved at this point. ? ?  ? ? ?FINAL CLINICAL IMPRESSION(S) / ED DIAGNOSES  ? ?Final diagnoses:  ?Acute bilateral low back pain without sciatica  ?Paresthesia of left arm and leg  ? ? ? ?Rx / DC Orders  ? ?ED Discharge Orders   ? ?      Ordered  ?  predniSONE (STERAPRED UNI-PAK 21 TAB) 10 MG (21) TBPK tablet       ? 12/22/21 1412  ? ?  ?  ? ?  ? ? ? ?Note:  This document was prepared using Dragon voice recognition software and may include unintentional dictation errors. ?  ?Sharman Cheek, MD ?12/22/21 1520 ? ?

## 2021-12-22 NOTE — Progress Notes (Signed)
?   12/22/21 1200  ?Clinical Encounter Type  ?Visited With Family  ?Visit Type ED  ?Referral From Other (Comment) ?(Code Stroke)  ? ?Chaplain responded to Code Stroke page and provided support to partner.  ?

## 2024-09-28 ENCOUNTER — Emergency Department

## 2024-09-28 ENCOUNTER — Other Ambulatory Visit: Payer: Self-pay

## 2024-09-28 ENCOUNTER — Encounter: Payer: Self-pay | Admitting: Emergency Medicine

## 2024-09-28 ENCOUNTER — Emergency Department
Admission: EM | Admit: 2024-09-28 | Discharge: 2024-09-28 | Disposition: A | Discharge status: Home / Self Care | Attending: Emergency Medicine | Admitting: Emergency Medicine

## 2024-09-28 DIAGNOSIS — R000 Tachycardia, unspecified: Secondary | ICD-10-CM | POA: Diagnosis present

## 2024-09-28 DIAGNOSIS — R519 Headache, unspecified: Secondary | ICD-10-CM | POA: Insufficient documentation

## 2024-09-28 LAB — COMPREHENSIVE METABOLIC PANEL WITH GFR
ALT: 60 U/L — ABNORMAL HIGH (ref 0–44)
AST: 35 U/L (ref 15–41)
Albumin: 4.6 g/dL (ref 3.5–5.0)
Alkaline Phosphatase: 65 U/L (ref 38–126)
Anion gap: 13 (ref 5–15)
BUN: 21 mg/dL — ABNORMAL HIGH (ref 6–20)
CO2: 20 mmol/L — ABNORMAL LOW (ref 22–32)
Calcium: 9.7 mg/dL (ref 8.9–10.3)
Chloride: 102 mmol/L (ref 98–111)
Creatinine, Ser: 0.91 mg/dL (ref 0.61–1.24)
GFR, Estimated: >60 mL/min
Glucose, Bld: 116 mg/dL — ABNORMAL HIGH (ref 70–99)
Potassium: 4.3 mmol/L (ref 3.5–5.1)
Sodium: 135 mmol/L (ref 135–145)
Total Bilirubin: 0.4 mg/dL (ref 0.0–1.2)
Total Protein: 7.1 g/dL (ref 6.5–8.1)

## 2024-09-28 LAB — CBC
HCT: 40.9 % (ref 39.0–52.0)
Hemoglobin: 14.2 g/dL (ref 13.0–17.0)
MCH: 30.1 pg (ref 26.0–34.0)
MCHC: 34.7 g/dL (ref 30.0–36.0)
MCV: 86.8 fL (ref 80.0–100.0)
Platelets: 285 K/uL (ref 150–400)
RBC: 4.71 MIL/uL (ref 4.22–5.81)
RDW: 12.2 % (ref 11.5–15.5)
WBC: 10.8 K/uL — ABNORMAL HIGH (ref 4.0–10.5)
nRBC: 0 % (ref 0.0–0.2)

## 2024-09-28 LAB — URINALYSIS, ROUTINE W REFLEX MICROSCOPIC
Bilirubin Urine: NEGATIVE
Glucose, UA: NEGATIVE mg/dL
Hgb urine dipstick: NEGATIVE
Ketones, ur: NEGATIVE mg/dL
Leukocytes,Ua: NEGATIVE
Nitrite: NEGATIVE
Protein, ur: NEGATIVE mg/dL
Specific Gravity, Urine: 1.011 (ref 1.005–1.030)
pH: 7 (ref 5.0–8.0)

## 2024-09-28 LAB — TROPONIN T, HIGH SENSITIVITY: Troponin T High Sensitivity: <15 ng/L (ref 0–19)

## 2024-09-28 LAB — TSH: TSH: 2.73 u[IU]/mL (ref 0.350–4.500)

## 2024-09-28 LAB — T4, FREE: Free T4: 1.58 ng/dL (ref 0.80–2.00)

## 2024-09-28 NOTE — ED Triage Notes (Signed)
 Patient ambulatory to triage with steady gait, without difficulty or distress noted; pt reports yesterday after doing yard work noted elevated HR, around 140 for several hrs; awoke this morning and temples were pounding with HR around 96 and has remained elevated; denies pain just feels weird; denies hx of same

## 2024-09-28 NOTE — ED Notes (Signed)
 Dr. Viviann at bedside for assessment

## 2024-09-28 NOTE — ED Notes (Signed)
Pt transported to XR at this time.

## 2024-09-28 NOTE — ED Provider Notes (Signed)
 "  [facility] Provider Note    Event Date/Time   First MD Initiated Contact with Patient 09/28/24 [phone]     (approximate)   History   Chief Complaint: Tachycardia   HPI  Jay Mcneil is a 54 y.o. male with a history of bipolar disorder, anxiety who comes ED due to heart rate.  Reports that a week ago he and his spouse were sick with flu, they have recovered.  However yesterday he was doing yard work and felt very tired, no his heart rate was about 140.  Last night he had sweats, and woke this morning with bilateral frontal headache.  Heart rate remained elevated in the 90s throughout the morning.  Denies chest pain or shortness of breath, no fever.  No vision changes paresthesias or motor weakness.   Reports having similar episodes in the past, completed Holter monitoring with his PCP, no sign of arrhythmia in the past.     Past Medical History:  Diagnosis Date   Bipolar disorder (HCC)    Crohn's disease (HCC)    Kidney stone     Current Outpatient Rx   Order #: [id number] Class: Normal   Order #: [id number] Class: Historical Med    Past Surgical History:  Procedure Laterality Date   perianal surgery      Physical Exam   Triage Vital Signs: ED Triage Vitals [09/28/24 0647]  Encounter Vitals Group     BP (!) 158/109     Girls Systolic BP Percentile      Girls Diastolic BP Percentile      Boys Systolic BP Percentile      Boys Diastolic BP Percentile      Pulse Rate (!) 105     Resp 20     Temp      Temp Source Oral     SpO2 99 %     Weight 165 lb (74.8 kg)     Height 6' (1.829 m)     Head Circumference      Peak Flow      Pain Score 0     Pain Loc      Pain Education      Exclude from Growth Chart     Most recent vital signs: Vitals:   09/28/24 0911 09/28/24 1118  BP: (!) 145/98 (!) 121/92  Pulse: 80 86  Resp: 12 16  Temp:  98.1 F (36.7 C)  SpO2: 100% 98%    General: Awake, no distress.  CV:  Good peripheral perfusion.   Regular rate rhythm, heart rate 85 Resp:  Normal effort.  Clear lungs Abd:  No distention.  Soft nontender Other:  Thyroid  nonpalpable   ED Results / Procedures / Treatments   Labs (all labs ordered are listed, but only abnormal results are displayed) Labs Reviewed  CBC - Abnormal; Notable for the following components:      Result Value   WBC 10.8 (*)    All other components within normal limits  COMPREHENSIVE METABOLIC PANEL WITH GFR - Abnormal; Notable for the following components:   CO2 20 (*)    Glucose, Bld 116 (*)    BUN 21 (*)    ALT 60 (*)    All other components within normal limits  URINALYSIS, ROUTINE W REFLEX MICROSCOPIC - Abnormal; Notable for the following components:   Color, Urine YELLOW (*)    APPearance CLEAR (*)    All other components within normal limits  T4, FREE  TSH  TROPONIN  T, HIGH SENSITIVITY     EKG Interpreted by me Normal sinus rhythm rate of 99.  Normal axis and intervals.  Normal QRS ST segments and T waves   RADIOLOGY Chest x-ray interpreted by me unremarkable.  Radiology report reviewed   PROCEDURES:  Procedures   MEDICATIONS ORDERED IN ED: Medications - No data to display   IMPRESSION / MDM / ASSESSMENT AND PLAN / ED COURSE  I reviewed the triage vital signs and the nursing notes.  DDx: Electrolyte derangement, AKI, anemia, NSTEMI, hypothyroidism, UTI, leukemia  Patient's presentation is most consistent with acute presentation with potential threat to life or bodily function.  Patient presents with episode of tachycardia yesterday, malaise and night sweats.  He is feeling better currently, vital signs are reassuring, exam unremarkable.  Will check labs   Clinical Course as of 09/28/24 1349  Wed Sep 28, 2024  1127 Labs normal, vitals remained normal in the ED, asymptomatic, stable for discharge.  Recommend cardiology follow-up for evaluation of transient palpitations/tachycardia. [PS]    Clinical Course User Index [PS]  Viviann Pastor, MD     FINAL CLINICAL IMPRESSION(S) / ED DIAGNOSES   Final diagnoses:  Tachycardia     Rx / DC Orders   ED Discharge Orders          Ordered    Ambulatory referral to Cardiology       Comments: If you have not heard from the Cardiology office within the next 72 hours please call [phone].   09/28/24 1127             Note:  This document was prepared using Dragon voice recognition software and may include unintentional dictation errors.   Viviann Pastor, MD 09/28/24 1349  "

## 2024-09-28 NOTE — Discharge Instructions (Addendum)
 Your tests in the emergency department today were all reassuring.  Continue to follow-up with your primary care doctor and make an appointment with cardiology regarding the rapid heart rate.

## 2024-09-29 ENCOUNTER — Emergency Department

## 2024-09-29 ENCOUNTER — Ambulatory Visit: Payer: Self-pay

## 2024-09-29 ENCOUNTER — Emergency Department
Admission: EM | Admit: 2024-09-29 | Discharge: 2024-09-29 | Disposition: A | Discharge status: Home / Self Care | Attending: Emergency Medicine | Admitting: Emergency Medicine

## 2024-09-29 ENCOUNTER — Other Ambulatory Visit: Payer: Self-pay

## 2024-09-29 DIAGNOSIS — D72829 Elevated white blood cell count, unspecified: Secondary | ICD-10-CM | POA: Diagnosis not present

## 2024-09-29 DIAGNOSIS — R000 Tachycardia, unspecified: Secondary | ICD-10-CM | POA: Diagnosis not present

## 2024-09-29 DIAGNOSIS — R1013 Epigastric pain: Secondary | ICD-10-CM | POA: Diagnosis present

## 2024-09-29 DIAGNOSIS — K859 Acute pancreatitis without necrosis or infection, unspecified: Secondary | ICD-10-CM | POA: Diagnosis not present

## 2024-09-29 LAB — COMPREHENSIVE METABOLIC PANEL WITH GFR
ALT: 55 U/L — ABNORMAL HIGH (ref 0–44)
AST: 34 U/L (ref 15–41)
Albumin: 4.8 g/dL (ref 3.5–5.0)
Alkaline Phosphatase: 65 U/L (ref 38–126)
Anion gap: 14 (ref 5–15)
BUN: 21 mg/dL — ABNORMAL HIGH (ref 6–20)
CO2: 21 mmol/L — ABNORMAL LOW (ref 22–32)
Calcium: 9.9 mg/dL (ref 8.9–10.3)
Chloride: 104 mmol/L (ref 98–111)
Creatinine, Ser: 0.91 mg/dL (ref 0.61–1.24)
GFR, Estimated: >60 mL/min
Glucose, Bld: 100 mg/dL — ABNORMAL HIGH (ref 70–99)
Potassium: 3.9 mmol/L (ref 3.5–5.1)
Sodium: 138 mmol/L (ref 135–145)
Total Bilirubin: 0.4 mg/dL (ref 0.0–1.2)
Total Protein: 7.5 g/dL (ref 6.5–8.1)

## 2024-09-29 LAB — CBC
HCT: 41.8 % (ref 39.0–52.0)
Hemoglobin: 14.3 g/dL (ref 13.0–17.0)
MCH: 30 pg (ref 26.0–34.0)
MCHC: 34.2 g/dL (ref 30.0–36.0)
MCV: 87.6 fL (ref 80.0–100.0)
Platelets: 307 K/uL (ref 150–400)
RBC: 4.77 MIL/uL (ref 4.22–5.81)
RDW: 12.1 % (ref 11.5–15.5)
WBC: 11.3 K/uL — ABNORMAL HIGH (ref 4.0–10.5)
nRBC: 0 % (ref 0.0–0.2)

## 2024-09-29 LAB — URINALYSIS, ROUTINE W REFLEX MICROSCOPIC
Bilirubin Urine: NEGATIVE
Glucose, UA: NEGATIVE mg/dL
Hgb urine dipstick: NEGATIVE
Ketones, ur: NEGATIVE mg/dL
Leukocytes,Ua: NEGATIVE
Nitrite: NEGATIVE
Protein, ur: NEGATIVE mg/dL
Specific Gravity, Urine: 1.011 (ref 1.005–1.030)
pH: 5 (ref 5.0–8.0)

## 2024-09-29 LAB — TROPONIN T, HIGH SENSITIVITY: Troponin T High Sensitivity: <15 ng/L (ref 0–19)

## 2024-09-29 LAB — LIPASE, BLOOD: Lipase: 380 U/L — ABNORMAL HIGH (ref 11–51)

## 2024-09-29 MED ORDER — IOHEXOL 300 MG/ML  SOLN
100.0000 mL | Freq: Once | INTRAMUSCULAR | Status: AC | PRN
  Administered 2024-09-29: 100 mL via INTRAVENOUS

## 2024-09-29 MED ORDER — LACTATED RINGERS IV BOLUS
1000.0000 mL | Freq: Once | INTRAVENOUS | Status: AC
  Administered 2024-09-29: 1000 mL via INTRAVENOUS

## 2024-09-29 NOTE — ED Provider Notes (Signed)
 "  [facility] Provider Note    Event Date/Time   First MD Initiated Contact with Patient 09/29/24 1711     (approximate)   History   Chief Complaint Tachycardia   HPI  Jay Mcneil is a 54 y.o. male with past medical history of Crohn's disease, kidney stones, and bipolar disorder who presents to the ED complaining of tachycardia.  Patient reports that he has been dealing with intermittently elevated heart rate over the past couple of days which seems to be worse with exertion.  He denies any associated chest pain or shortness of breath, states he has been drinking plenty of fluids during this time.  He was seen in the ED for the symptoms yesterday and had unremarkable workup at that time, including TSH.  He returns today following a similar episode, but has also noticed some discomfort in his epigastrium that is worse when he is eating today.  He has occasionally felt nauseous but has not had any vomiting, denies any changes in his bowel movements.  He denies any fevers or cough and has not had any dysuria or flank pain.     Physical Exam   Triage Vital Signs: ED Triage Vitals  Encounter Vitals Group     BP 09/29/24 1456 (!) 146/96     Girls Systolic BP Percentile --      Girls Diastolic BP Percentile --      Boys Systolic BP Percentile --      Boys Diastolic BP Percentile --      Pulse Rate 09/29/24 1456 (!) 103     Resp --      Temp 09/29/24 1456 98.4 F (36.9 C)     Temp Source 09/29/24 1456 Oral     SpO2 09/29/24 1456 99 %     Weight --      Height --      Head Circumference --      Peak Flow --      Pain Score 09/29/24 1458 6     Pain Loc --      Pain Education --      Exclude from Growth Chart --     Most recent vital signs: Vitals:   09/29/24 1456  BP: (!) 146/96  Pulse: (!) 103  Temp: 98.4 F (36.9 C)  SpO2: 99%    Constitutional: Alert and oriented. Eyes: Conjunctivae are normal. Head: Atraumatic. Nose: No  congestion/rhinnorhea. Mouth/Throat: Mucous membranes are moist.  Cardiovascular: Normal rate, regular rhythm. Grossly normal heart sounds.  2+ radial pulses bilaterally. Respiratory: Normal respiratory effort.  No retractions. Lungs CTAB. Gastrointestinal: Soft and tender to palpation in the epigastrium with no rebound or guarding. No distention. Musculoskeletal: No lower extremity tenderness nor edema.  Neurologic:  Normal speech and language. No gross focal neurologic deficits are appreciated.    ED Results / Procedures / Treatments   Labs (all labs ordered are listed, but only abnormal results are displayed) Labs Reviewed  LIPASE, BLOOD - Abnormal; Notable for the following components:      Result Value   Lipase 380 (*)    All other components within normal limits  COMPREHENSIVE METABOLIC PANEL WITH GFR - Abnormal; Notable for the following components:   CO2 21 (*)    Glucose, Bld 100 (*)    BUN 21 (*)    ALT 55 (*)    All other components within normal limits  CBC - Abnormal; Notable for the following components:   WBC 11.3 (*)  All other components within normal limits  URINALYSIS, ROUTINE W REFLEX MICROSCOPIC - Abnormal; Notable for the following components:   Color, Urine STRAW (*)    APPearance CLEAR (*)    All other components within normal limits  TROPONIN T, HIGH SENSITIVITY     EKG  ED ECG REPORT I, Carlin Palin, the attending physician, personally viewed and interpreted this ECG.   Date: 09/29/2024  EKG Time: 15:01  Rate: 94  Rhythm: normal sinus rhythm  Axis: Normal  Intervals:none  ST&T Change: None  RADIOLOGY CT abdomen/pelvis reviewed and interpreted by me with no inflammatory changes, focal fluid collections, or dilated bowel loops.  PROCEDURES:  Critical Care performed: No  Procedures   MEDICATIONS ORDERED IN ED: Medications  lactated ringers  bolus 1,000 mL (0 mLs Intravenous Stopped 09/29/24 2006)  iohexol  (OMNIPAQUE ) 300 MG/ML  solution 100 mL (100 mLs Intravenous Contrast Given 09/29/24 1852)     IMPRESSION / MDM / ASSESSMENT AND PLAN / ED COURSE  I reviewed the triage vital signs and the nursing notes.                              54 y.o. male with past medical history of Crohn's disease, kidney stones, and bipolar disorder who presents to the ED complaining of intermittent tachycardia with exertion over the past couple of days with upper abdominal pain and nausea today.  Patient's presentation is most consistent with acute presentation with potential threat to life or bodily function.  Differential diagnosis includes, but is not limited to, ACS, arrhythmia, pancreatitis, hepatitis, cholecystitis, biliary colic, anemia, electrolyte abnormality, AKI, dehydration.  Patient nontoxic-appearing and in no acute distress, vital signs markable for mild tachycardia but otherwise reassuring.  EKG shows sinus tachycardia with no ischemic changes, troponin within normal limits and no symptoms to suggest ACS or PE.  Additional labs without significant anemia, leukocytosis, electrolyte abnormality, or AKI.  LFTs are unremarkable but patient does have elevated lipase at 380.  He denies any history of pancreatitis, does not drink alcohol.  CT imaging was performed and negative for acute finding, no evidence of biliary pathology contributing to pancreatitis.  Patient with minimal pain on reassessment and is tolerating oral intake without difficulty.  Admission considered but patient already has established follow-up with GI and with otherwise reassuring workup, is appropriate for discharge home with outpatient follow-up.  He was counseled to return to the ED for new or worsening symptoms, patient agrees with plan.      FINAL CLINICAL IMPRESSION(S) / ED DIAGNOSES   Final diagnoses:  Acute pancreatitis without infection or necrosis, unspecified pancreatitis type  Tachycardia     Rx / DC Orders   ED Discharge Orders     None         Note:  This document was prepared using Dragon voice recognition software and may include unintentional dictation errors.   Palin Carlin, MD 09/29/24 2017  "

## 2024-09-29 NOTE — ED Triage Notes (Signed)
 Pt to ED via POV from home. Pt reports yesterday was seen for tachycardia and HR in 140s. Pt reports was discharged. Pt reports went to eat and felt his heart race again. Pt tried to rest but states HR was reading 108. Pt also reports abd pain.

## 2024-09-29 NOTE — Telephone Encounter (Signed)
 FYI Only or Action Required?: FYI only for provider: ED advised.  Patient was last seen in primary care on New Patient.  Called Nurse Triage reporting Tachycardia.  Symptoms began yesterday.  Interventions attempted: Other: Seen in ED on 1/14.  Symptoms are: gradually worsening.  Triage Disposition: Go to ED Now (Notify PCP)  Patient/caregiver understands and will follow disposition?: Yes             Copied from CRM [id number]. Topic: Clinical - Medical Advice >> Sep 29, 2024  9:31 AM Diannia H wrote: Reason for CRM: Patient is calling because he is not understanding why his kidneys are bothering him. He went to the ER on 09/28/2024 and he basically said they didn't really give him an answer. He doesn't know if its his sugar, blood pressure but yesterday all that was normal. He stated soon as he ate something his heart rate went up. Could you assist? Patients callback number is [phone]. Reason for Disposition  [1] SEVERE pain (e.g., excruciating, scale 8-10) AND [2] not improved after pain medicine  Answer Assessment - Initial Assessment Questions 1. LOCATION: Where does it hurt? (e.g., left, right)     BIL flank pain   2. ONSET: When did the pain start?     1/14   3. SEVERITY: How bad is the pain? (e.g., Scale 1-10; mild, moderate, or severe)     Severe    4. PATTERN: Does the pain come and go, or is it constant?      Constant since onset   5. CAUSE: What do you think is causing the pain?     Unsure    6. OTHER SYMPTOMS:  Do you have any other symptoms? (e.g., fever, abdomen pain, vomiting, leg weakness, burning with urination, blood in urine)      Tachycardia    Patient called in to triage with complaints of flank pain, tachycardia This has been ongoing for one day.  The patient stated he was seen in the ED yesterday with no significant findings found.    Per symptoms patient referred back to ED for immediate evaluation. He was also  advised to follow-up with PCP as we do not service that provider in [place]. Patient agrees with the plan of care.  Protocols used: Flank Pain-A-AH
